# Patient Record
Sex: Male | Born: 1960 | Race: Black or African American | Hispanic: No | Marital: Married | Smoking: Never smoker
Health system: Southern US, Community
[De-identification: ages and names within clinical notes are randomized; demographics above are authoritative.]

## PROBLEM LIST (undated history)

## (undated) DIAGNOSIS — E119 Type 2 diabetes mellitus without complications: Secondary | ICD-10-CM

## (undated) DIAGNOSIS — K8020 Calculus of gallbladder without cholecystitis without obstruction: Secondary | ICD-10-CM

## (undated) DIAGNOSIS — I10 Essential (primary) hypertension: Secondary | ICD-10-CM

## (undated) DIAGNOSIS — C61 Malignant neoplasm of prostate: Secondary | ICD-10-CM

## (undated) DIAGNOSIS — N393 Stress incontinence (female) (male): Secondary | ICD-10-CM

## (undated) HISTORY — DX: Type 2 diabetes mellitus without complications: E11.9

## (undated) HISTORY — PX: NOSE SURGERY: SHX723

## (undated) HISTORY — DX: Calculus of gallbladder without cholecystitis without obstruction: K80.20

## (undated) HISTORY — PX: KNEE SURGERY: SHX244

## (undated) HISTORY — PX: PROSTATE BIOPSY: SHX241

## (undated) HISTORY — PX: CHOLECYSTECTOMY: SHX55

## (undated) HISTORY — DX: Essential (primary) hypertension: I10

## (undated) HISTORY — PX: VASECTOMY: SHX75

---

## 2001-09-24 ENCOUNTER — Emergency Department (HOSPITAL_COMMUNITY): Admission: EM | Admit: 2001-09-24 | Discharge: 2001-09-24 | Payer: Self-pay | Admitting: Emergency Medicine

## 2001-09-24 ENCOUNTER — Encounter: Payer: Self-pay | Admitting: Emergency Medicine

## 2004-10-16 ENCOUNTER — Ambulatory Visit (HOSPITAL_COMMUNITY): Admission: RE | Admit: 2004-10-16 | Discharge: 2004-10-16 | Payer: Self-pay | Admitting: Orthopedic Surgery

## 2006-07-03 IMAGING — CR DG ORBITS FOR FOREIGN BODY
2 series · 2 of 2 positions shown · non-contrast
Comparison: none

CLINICAL DATA: Metal exposure to the eyes, pre-MRI evaluation.

EYE FOR FOREIGN BODY DETECTION
Two frontal views of the orbits demonstrate no orbital metallic foreign bodies. 
Right maxillary sinus air-fluid level. Small frontal sinuses.
IMPRESSION
1. No orbital metallic foreign body.
2. Acute right maxillary sinusitis.

[view not recorded (1 of 2)]
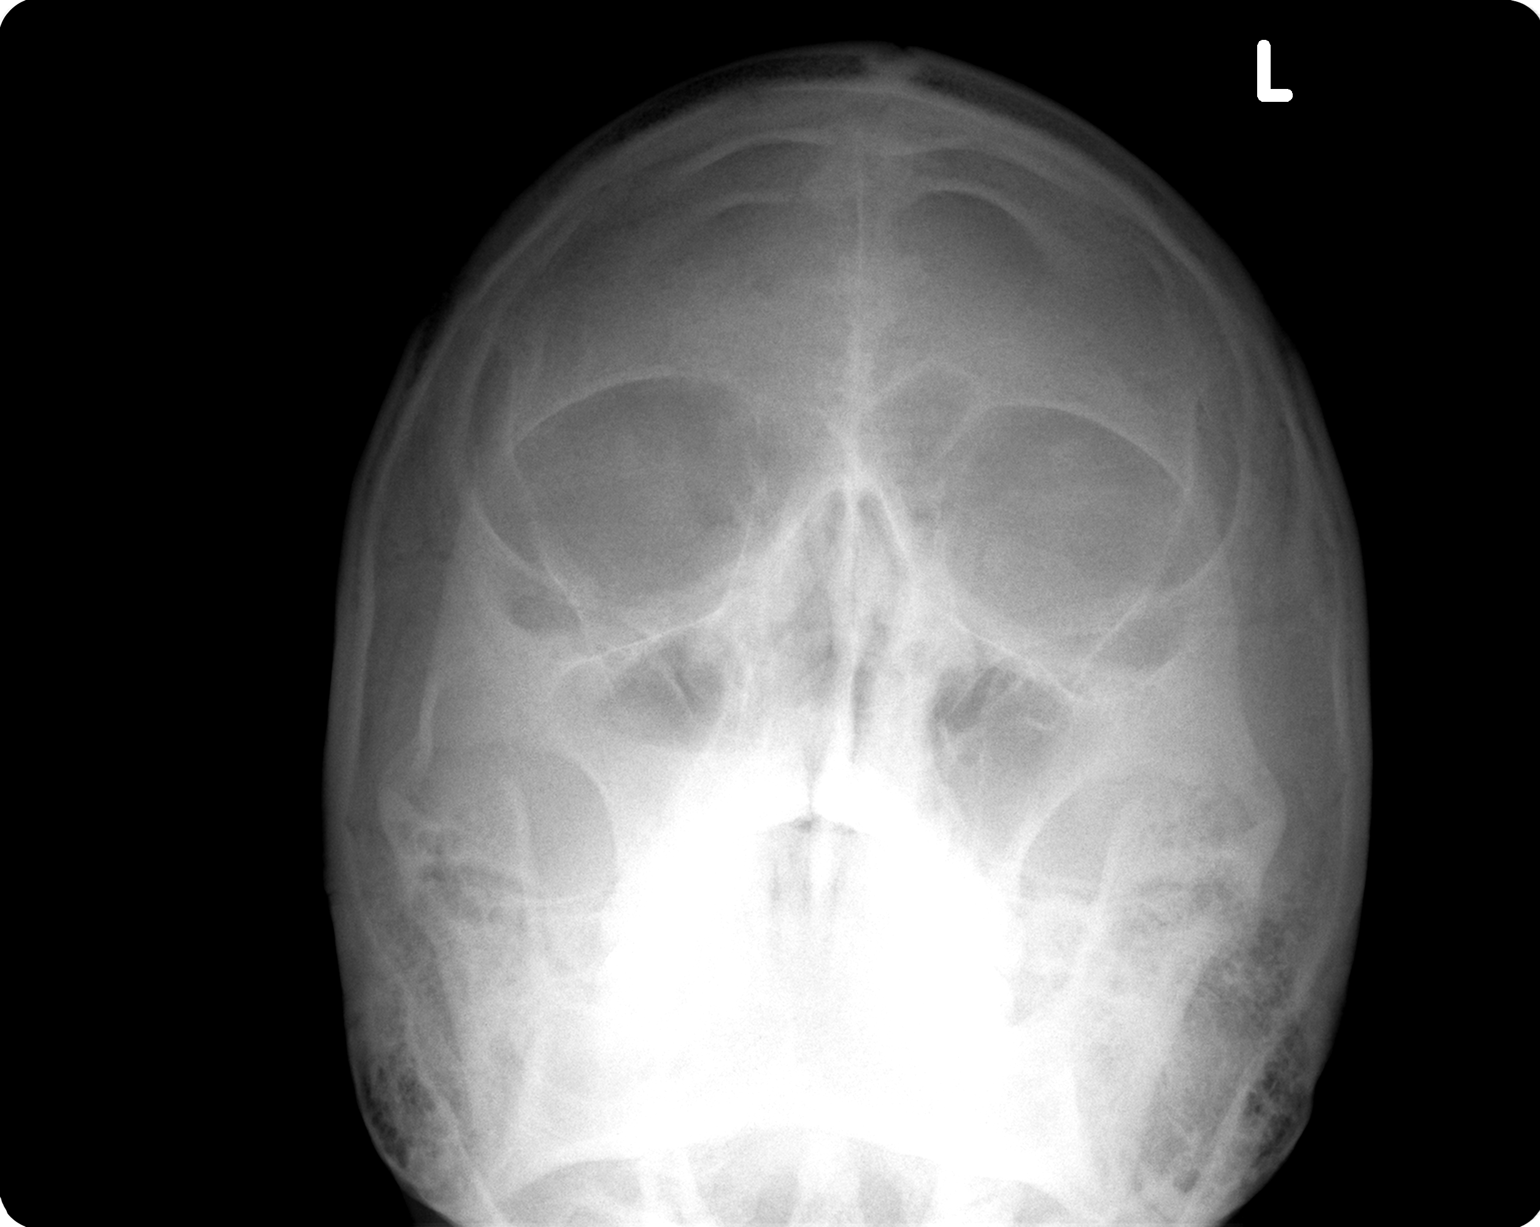

[view not recorded (2 of 2)]
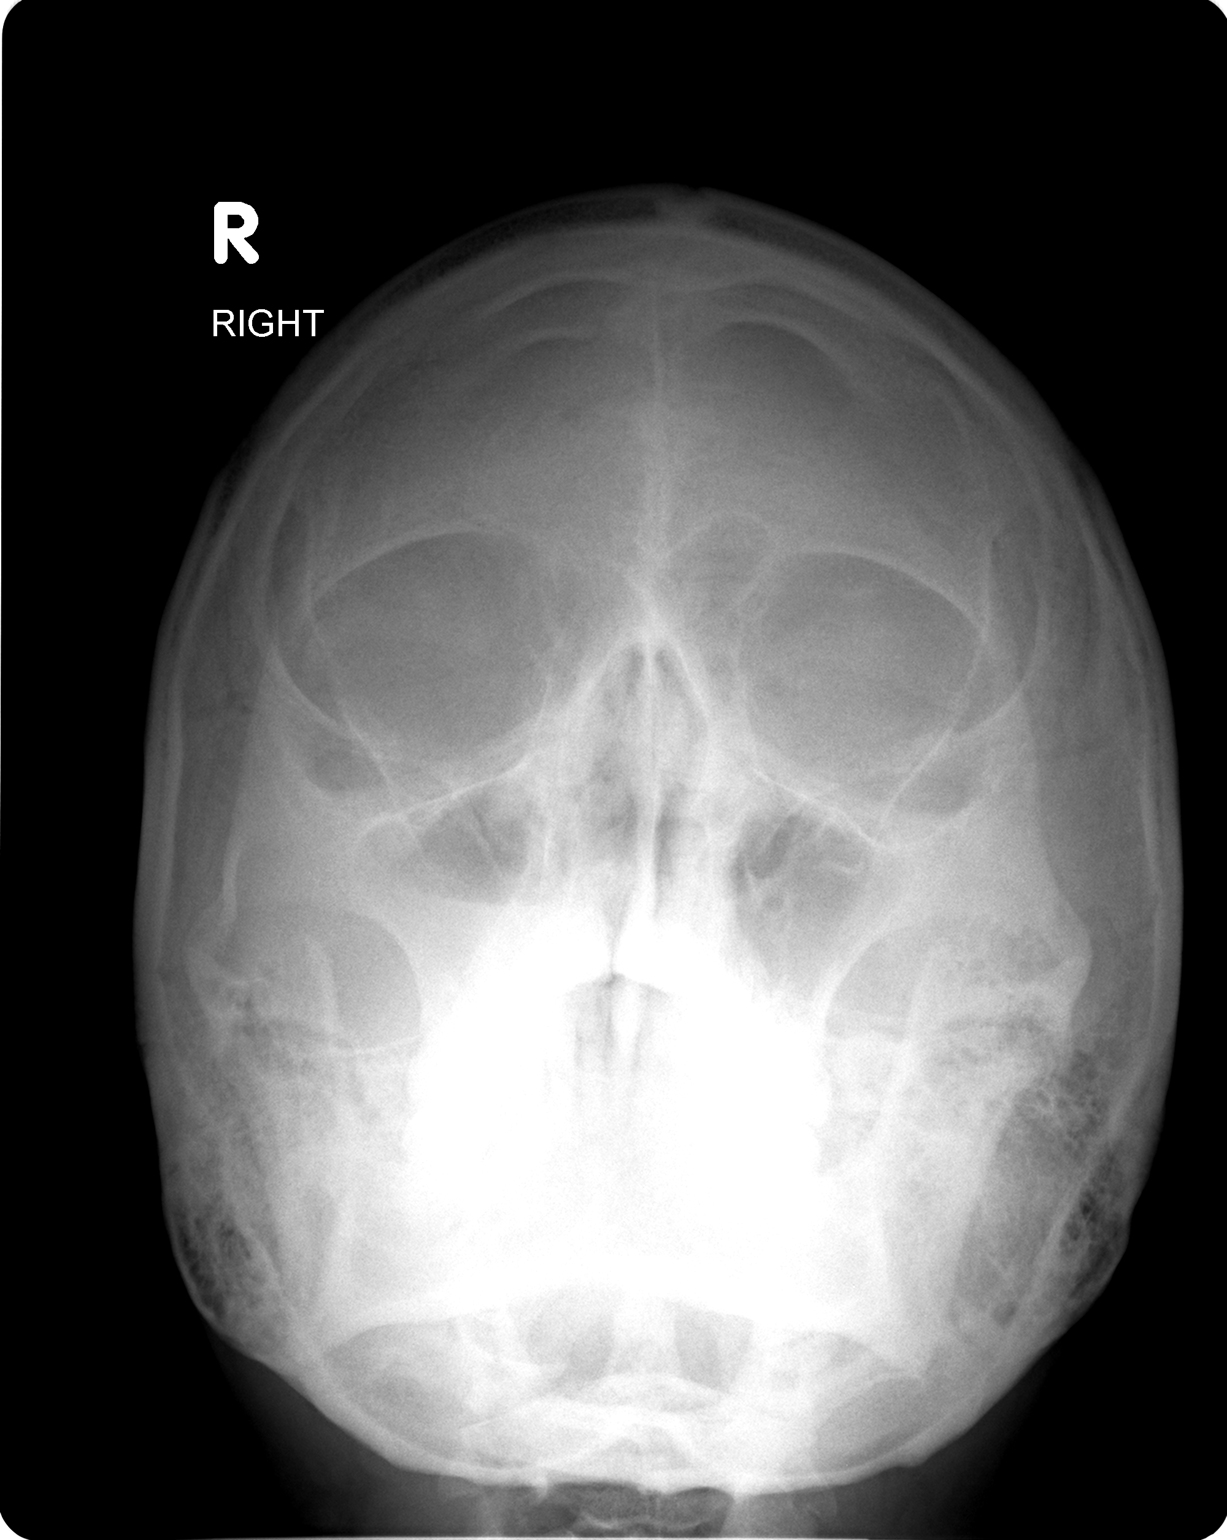

[2 of 2 positions shown; findings below may reference images not displayed]

## 2011-04-15 ENCOUNTER — Other Ambulatory Visit: Payer: Self-pay | Admitting: Family Medicine

## 2011-04-15 DIAGNOSIS — R945 Abnormal results of liver function studies: Secondary | ICD-10-CM

## 2011-04-17 ENCOUNTER — Ambulatory Visit
Admission: RE | Admit: 2011-04-17 | Discharge: 2011-04-17 | Disposition: A | Discharge status: Home / Self Care | Payer: BC Managed Care – PPO | Source: Ambulatory Visit | Attending: Family Medicine | Admitting: Family Medicine

## 2011-04-17 DIAGNOSIS — R945 Abnormal results of liver function studies: Secondary | ICD-10-CM

## 2012-03-15 ENCOUNTER — Encounter: Payer: Self-pay | Admitting: Gastroenterology

## 2012-04-01 ENCOUNTER — Ambulatory Visit (INDEPENDENT_AMBULATORY_CARE_PROVIDER_SITE_OTHER): Payer: BC Managed Care – PPO | Admitting: Gastroenterology

## 2012-04-01 ENCOUNTER — Encounter: Payer: Self-pay | Admitting: Gastroenterology

## 2012-04-01 VITALS — BP 120/70 | HR 76 | Ht 69.0 in | Wt 195.6 lb

## 2012-04-01 DIAGNOSIS — Z1211 Encounter for screening for malignant neoplasm of colon: Secondary | ICD-10-CM

## 2012-04-01 MED ORDER — PEG-KCL-NACL-NASULF-NA ASC-C 100 G PO SOLR: 1.0000 | Freq: Once | ORAL | Status: DC

## 2012-04-01 NOTE — Patient Instructions (Addendum)
You will be set up for a colonoscopy for routine screening.  Double prep (LEC, moderate sedation) A copy of this information will be made available to Dr. Warrick Parisian.

## 2012-04-01 NOTE — Progress Notes (Signed)
HPI: This is a   very pleasant 52 year old man whom I am meeting for the first time today  Had colonoscopy 09/2010 with Dr. Rodena Piety "inadequate prep" single small polyp removed, was HP on pathology.  Was told to have repeat colonoscopy 1 year due to prep.  He was not satisfied with whole experience.  Tells me he had to wait about 3 hours in the waiting room before his procedure and was never given an explanation as to why  No colon cancer in family, no bowel issues.   Review of systems: Pertinent positive and negative review of systems were noted in the above HPI section. Complete review of systems was performed and was otherwise normal.    Past Medical History  Diagnosis Date  . Diabetes   . Hypertension   . Allergic rhinitis, cause unspecified   . Gallstones     Past Surgical History  Procedure Date  . Cholecystectomy   . Knee surgery     right  . Nose surgery     Current Outpatient Prescriptions  Medication Sig Dispense Refill  . metFORMIN (GLUCOPHAGE) 500 MG tablet Take 500 mg by mouth daily with breakfast.        Allergies as of 04/01/2012  . (No Known Allergies)    Family History  Problem Relation Age of Onset  . Diabetes Father   . Multiple myeloma Paternal Grandfather   . Ovarian cancer Paternal Grandmother     History   Social History  . Marital Status: Married    Spouse Name: N/A    Number of Children: N/A  . Years of Education: N/A   Occupational History  . Not on file.   Social History Main Topics  . Smoking status: Never Smoker   . Smokeless tobacco: Not on file  . Alcohol Use: Yes     Comment: 1/2 alcoholic beverage daily  . Drug Use: No  . Sexually Active: Not on file   Other Topics Concern  . Not on file   Social History Narrative  . No narrative on file       Physical Exam: BP 120/70  Pulse 76  Ht 5\' 9"  (1.753 m)  Wt 195 lb 9.6 oz (88.724 kg)  BMI 28.89 kg/m2 Constitutional: generally well-appearing Psychiatric:  alert and oriented x3 Eyes: extraocular movements intact Mouth: oral pharynx moist, no lesions Neck: supple no lymphadenopathy Cardiovascular: heart regular rate and rhythm Lungs: clear to auscultation bilaterally Abdomen: soft, nontender, nondistended, no obvious ascites, no peritoneal signs, normal bowel sounds Extremities: no lower extremity edema bilaterally Skin: no lesions on visible extremities    Assessment and plan: 52 y.o. male with  routine risk for colon cancer, inadequate prep on colonoscopy 1-2 years ago at outside facility  We will proceed with full colonoscopy at his soonest convenience. He'll undergo a "double prep" in order to ensure good visibility during the procedure.

## 2012-04-25 ENCOUNTER — Other Ambulatory Visit: Payer: Self-pay | Admitting: Gastroenterology

## 2012-04-25 ENCOUNTER — Ambulatory Visit (AMBULATORY_SURGERY_CENTER): Payer: BC Managed Care – PPO | Admitting: Gastroenterology

## 2012-04-25 ENCOUNTER — Encounter: Payer: Self-pay | Admitting: Gastroenterology

## 2012-04-25 VITALS — BP 102/63 | HR 65 | Resp 15 | Ht 69.0 in | Wt 195.0 lb

## 2012-04-25 DIAGNOSIS — Z1211 Encounter for screening for malignant neoplasm of colon: Secondary | ICD-10-CM

## 2012-04-25 DIAGNOSIS — D126 Benign neoplasm of colon, unspecified: Secondary | ICD-10-CM

## 2012-04-25 MED ORDER — SODIUM CHLORIDE 0.9 % IV SOLN: 500.0000 mL | INTRAVENOUS | Status: DC

## 2012-04-25 NOTE — Op Note (Signed)
[facility] [address] [place], [address]   COLONOSCOPY PROCEDURE REPORT  PATIENT: Mario Molina, Mario Molina  MR#: [id number] BIRTHDATE: 18-Feb-1961 , 52  yrs. old GENDER: Male ENDOSCOPIST: Rachael Fee, MD REFERRED NW:GNFAOZ Buckner Malta, M.D. PROCEDURE DATE:  04/25/2012 PROCEDURE:   Colonoscopy, screening ASA CLASS:   Class II INDICATIONS:average risk screening, had colonoscopy 2012 with Dr. Noe Gens with poor prep, small HP found MEDICATIONS: Fentanyl 75 mcg IV, Versed 8 mg IV, and These medications were titrated to patient response per physician's verbal order  DESCRIPTION OF PROCEDURE:   After the risks benefits and alternatives of the procedure were thoroughly explained, informed consent was obtained.  A digital rectal exam revealed no abnormalities of the rectum.   The LB [id number]  endoscope was introduced through the anus and advanced to the cecum, which was identified by both the appendix and ileocecal valve. No adverse events experienced.   The quality of the prep was good, using MoviPrep  The instrument was then slowly withdrawn as the colon was fully examined.  COLON FINDINGS: There was one small polyp found, removed and not retrieved.  This wsa 2mm, sessile, ascending segment, removed with cold snare.  The examination was otherwise normal.  Retroflexed views revealed no abnormalities. The time to cecum=1 minutes 53 seconds.  Withdrawal time=9 minutes 39 seconds.  The scope was withdrawn and the procedure completed. COMPLICATIONS: There were no complications.  ENDOSCOPIC IMPRESSION: There was one small polyp found, removed and not retrieved. The examination was otherwise normal.  RECOMMENDATIONS: You should continue to follow colorectal cancer screening guidelines for "routine risk" patients with a repeat colonoscopy in 10 years. There is no need for FOBT (stool) testing for at least 5 years.   eSigned:  Rachael Fee, MD 04/25/2012 3:02  PM

## 2012-04-25 NOTE — Patient Instructions (Signed)
YOU HAD AN ENDOSCOPIC PROCEDURE TODAY AT THE [facility]: Refer to the procedure report that was given to you for any specific questions about what was found during the examination.  If the procedure report does not answer your questions, please call your gastroenterologist to clarify.  If you requested that your care partner not be given the details of your procedure findings, then the procedure report has been included in a sealed envelope for you to review at your convenience later.  YOU SHOULD EXPECT: Some feelings of bloating in the abdomen. Passage of more gas than usual.  Walking can help get rid of the air that was put into your GI tract during the procedure and reduce the bloating. If you had a lower endoscopy (such as a colonoscopy or flexible sigmoidoscopy) you may notice spotting of blood in your stool or on the toilet paper. If you underwent a bowel prep for your procedure, then you may not have a normal bowel movement for a few days.  DIET: Your first meal following the procedure should be a light meal and then it is ok to progress to your normal diet.  A half-sandwich or bowl of soup is an example of a good first meal.  Heavy or fried foods are harder to digest and may make you feel nauseous or bloated.  Likewise meals heavy in dairy and vegetables can cause extra gas to form and this can also increase the bloating.  Drink plenty of fluids but you should avoid alcoholic beverages for 24 hours.  ACTIVITY: Your care partner should take you home directly after the procedure.  You should plan to take it easy, moving slowly for the rest of the day.  You can resume normal activity the day after the procedure however you should NOT DRIVE or use heavy machinery for 24 hours (because of the sedation medicines used during the test).    SYMPTOMS TO REPORT IMMEDIATELY: A gastroenterologist can be reached at any hour.  During normal business hours, 8:30 AM to 5:00 PM Monday through Friday,  call [phone].  After hours and on weekends, please call the GI answering service at [phone] who will take a message and have the physician on call contact you.   Following lower endoscopy (colonoscopy or flexible sigmoidoscopy):  Excessive amounts of blood in the stool  Significant tenderness or worsening of abdominal pains  Swelling of the abdomen that is new, acute  Fever of 100F or higher    FOLLOW UP: If any biopsies were taken you will be contacted by phone or by letter within the next 1-3 weeks.  Call your gastroenterologist if you have not heard about the biopsies in 3 weeks.  Our staff will call the home number listed on your records the next business day following your procedure to check on you and address any questions or concerns that you may have at that time regarding the information given to you following your procedure. This is a courtesy call and so if there is no answer at the home number and we have not heard from you through the emergency physician on call, we will assume that you have returned to your regular daily activities without incident.  SIGNATURES/CONFIDENTIALITY: You and/or your care partner have signed paperwork which will be entered into your electronic medical record.  These signatures attest to the fact that that the information above on your After Visit Summary has been reviewed and is understood.  Full responsibility of the confidentiality   of this discharge information lies with you and/or your care-partner.     

## 2012-04-25 NOTE — Progress Notes (Signed)
Patient did not experience any of the following events: a burn prior to discharge; a fall within the facility; wrong site/side/patient/procedure/implant event; or a hospital transfer or hospital admission upon discharge from the facility. (G8907) Patient did not have preoperative order for IV antibiotic SSI prophylaxis. (G8918)  

## 2012-04-25 NOTE — Progress Notes (Signed)
Ascending colon polyp removed but not collected for pathology.

## 2012-04-26 ENCOUNTER — Telehealth: Payer: Self-pay | Admitting: *Deleted

## 2012-04-26 NOTE — Telephone Encounter (Signed)
  Follow up Call-  Call back number 04/25/2012  Post procedure Call Back phone  # [phone]  Permission to leave phone message Yes     Patient questions:  Do you have a fever, pain , or abdominal swelling? no Pain Score  0 *  Have you tolerated food without any problems? yes  Have you been able to return to your normal activities? yes  Do you have any questions about your discharge instructions: Diet   no Medications  no Follow up visit  no  Do you have questions or concerns about your Care? no  Actions: * If pain score is 4 or above: No action needed, pain <4.

## 2013-01-01 IMAGING — US US ABDOMEN COMPLETE
1 series · 14 of 25 positions shown · non-contrast
Comparison: None.

CLINICAL DATA: Abnormal liver function tests

COMPLETE ABDOMINAL ULTRASOUND

[Series 1: us abdomen complete · 14 of 63 slices shown]
[im 1/63]
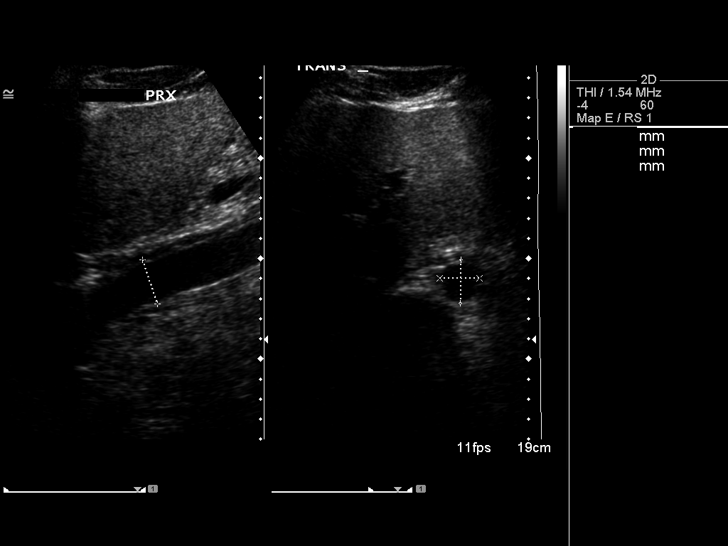
[im 6/63]
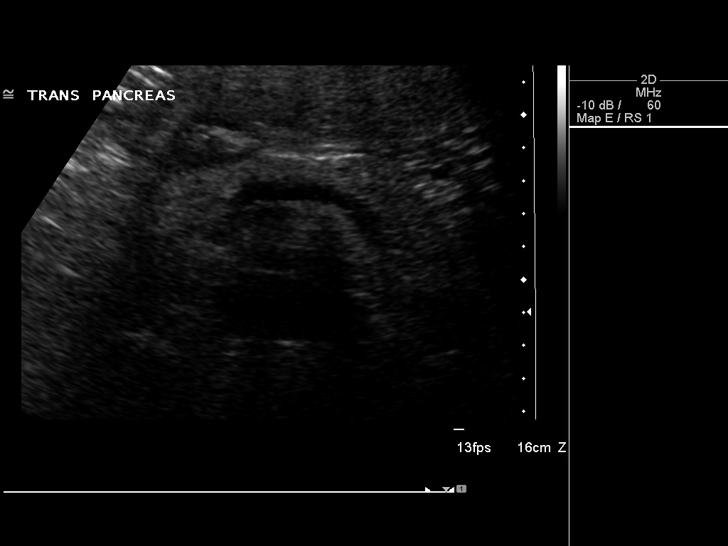
[im 11/63]
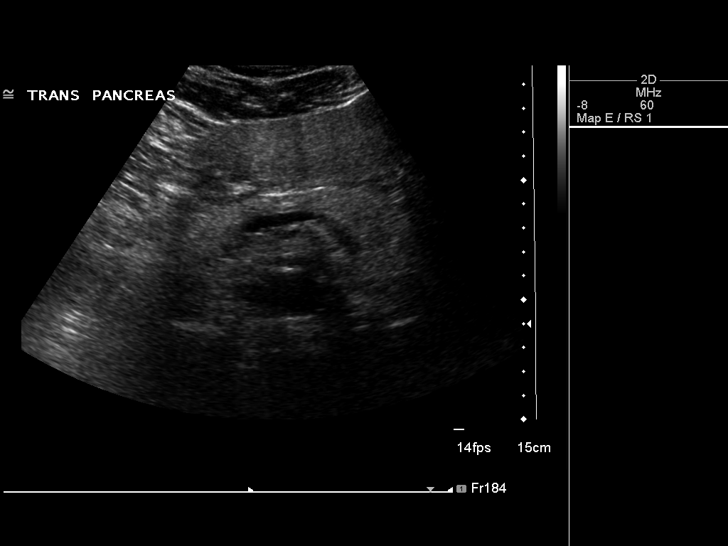
[im 16/63]
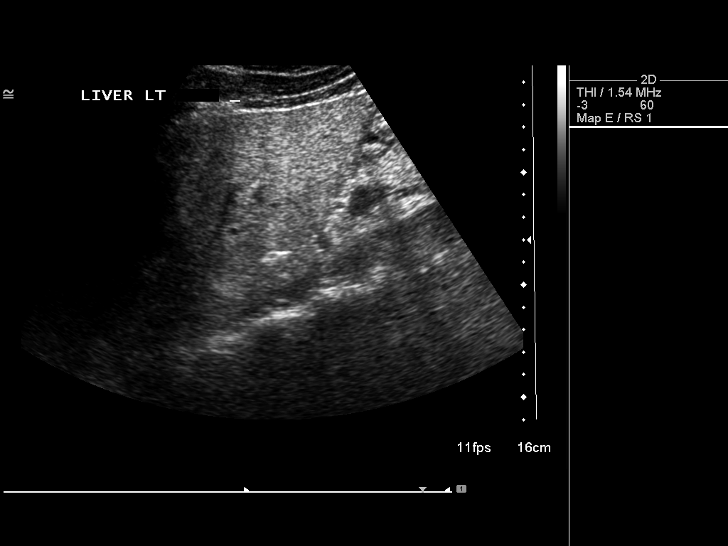
[im 21/63]
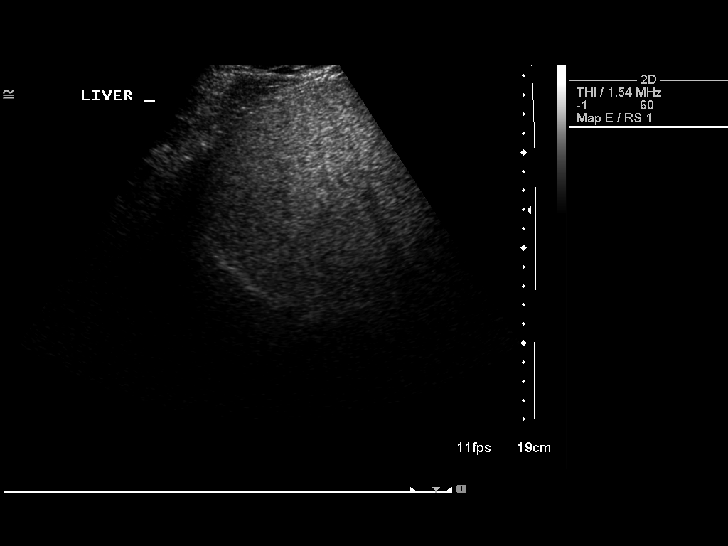
[im 24/63]
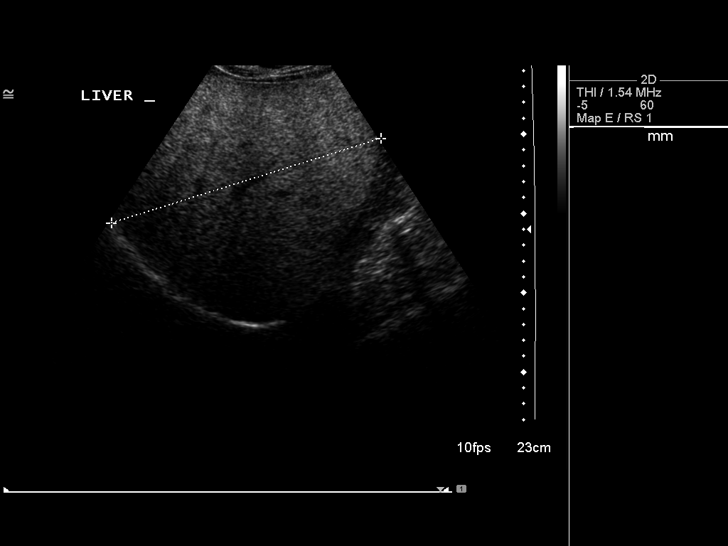
[im 29/63]
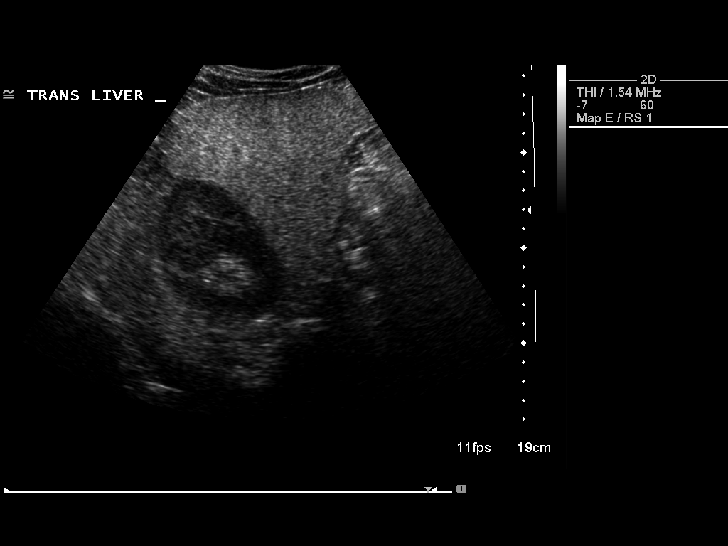
[im 34/63]
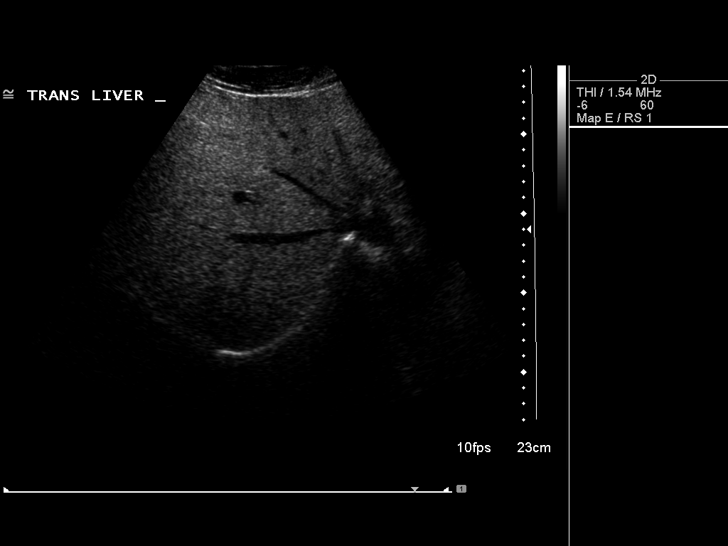
[im 39/63]
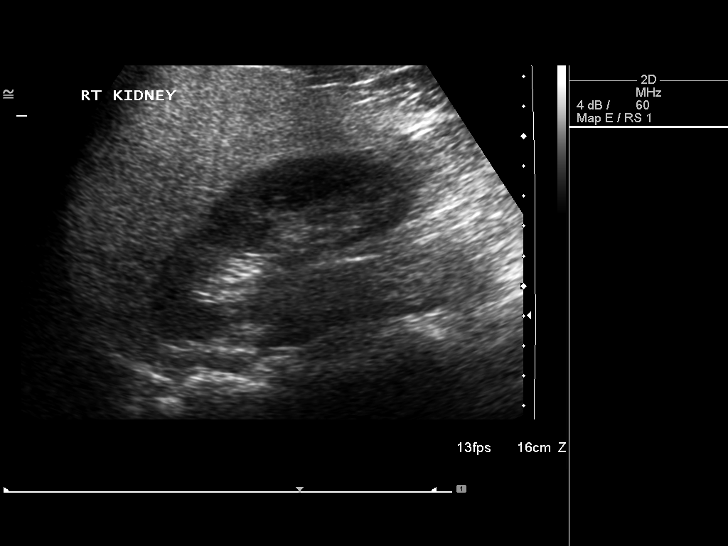
[im 42/63]
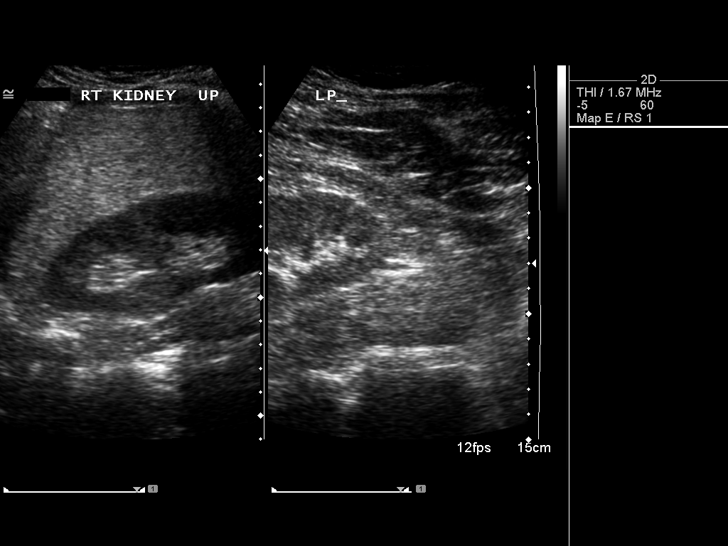
[im 47/63]
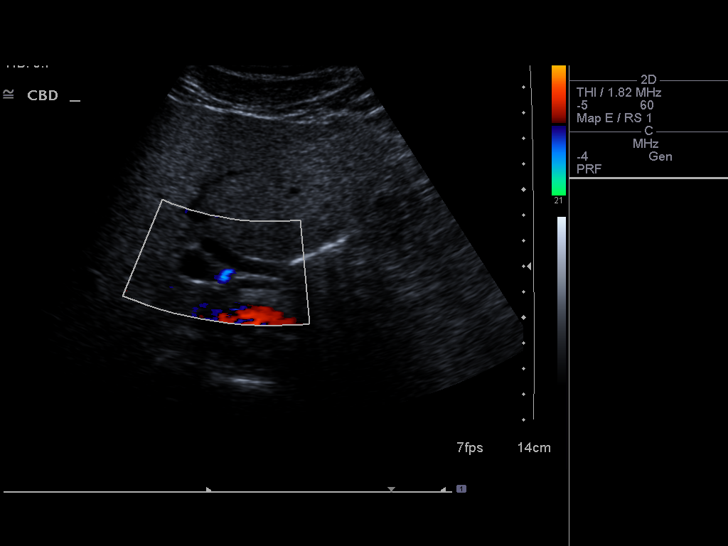
[im 52/63]
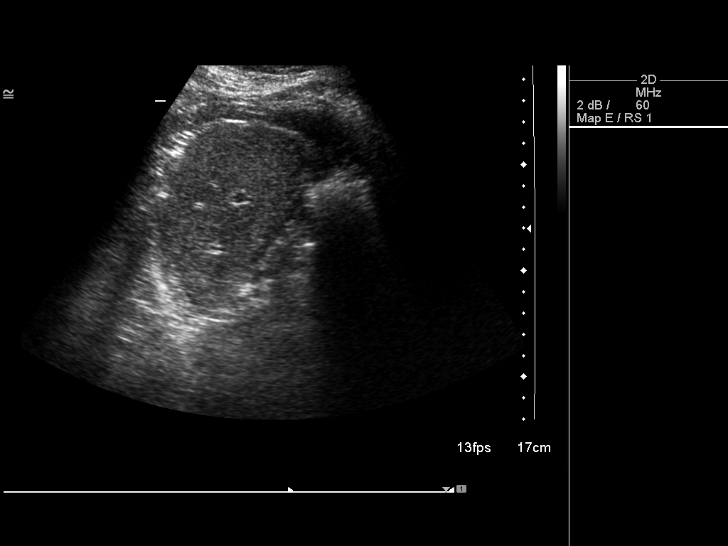
[im 57/63]
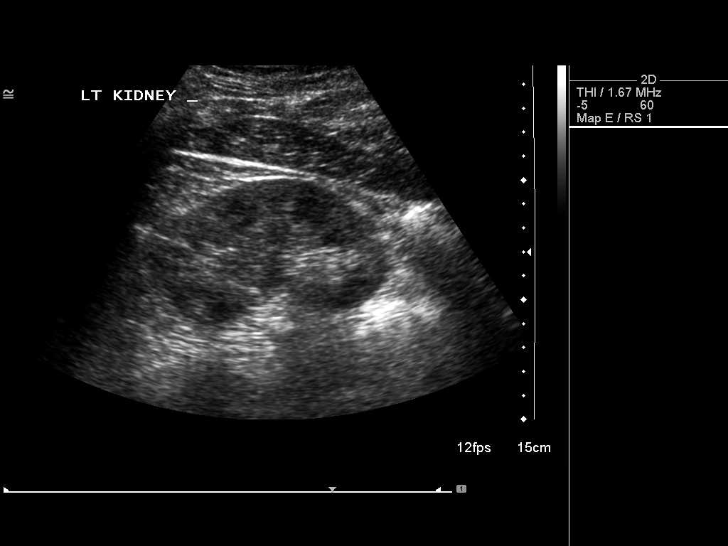
[im 63/63]
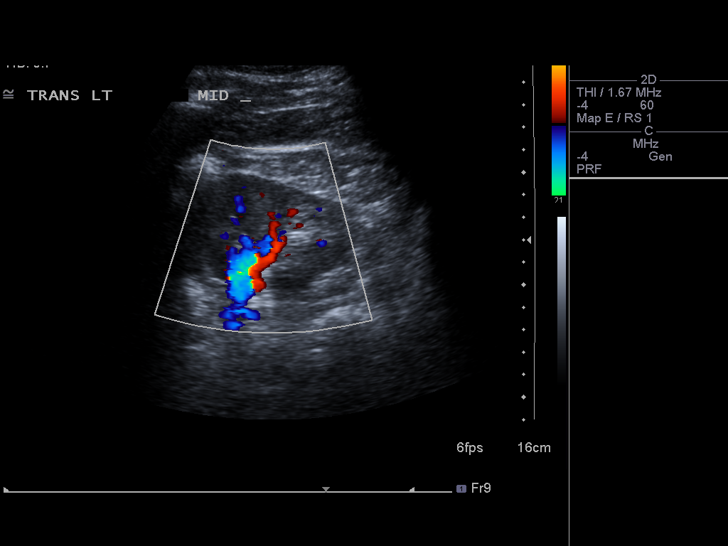

[14 of 25 positions shown; findings below may reference images not displayed]

FINDINGS: Gallbladder:  Vertically absent.

Common bile duct:  Measures 6 mm.

Liver:  No focal lesion identified.  Hyperechoic hepatic
parenchyma, suggesting hepatic steatosis.

IVC:  Appears normal.

Pancreas:  Into the visualized but grossly unremarkable.

Spleen:  Measures 6.2 cm.

Right Kidney:  Measures 10.3 cm.  No mass or hydronephrosis.

Left Kidney:  Measures 10.1 cm.  No mass or hydronephrosis.

Abdominal aorta:  No aneurysm identified.
IMPRESSION: Suspected hepatic steatosis.

Status post cholecystectomy.  Common duct measures 6 mm.

## 2015-10-30 ENCOUNTER — Encounter: Payer: Self-pay | Admitting: Radiation Oncology

## 2015-10-30 NOTE — Progress Notes (Signed)
GU Location of Tumor / Histology: prostatic adenocarcinoma  If Prostate Cancer, Gleason Score is (3 + 4) and PSA is (5.1)  Richardson Chiquito presented with an elevated PSA of 4.8 in December 2016. Biopsy obtained but, no tx pursued. Repeat biopsy done which showed progression 10/14/15  Second biopsy of prostate (if applicable) revealed:  Past/Anticipated interventions by urology, if any: biopsy x 2 and referral to Dr. Tammi Klippel to discuss radiation options  Past/Anticipated interventions by medical oncology, if any: no  Weight changes, if any: no  Bowel/Bladder complaints, if any:  No ED. IPSS 6 with frequency and urgency. Denies incontinence or leakage. Denies dysuria or hematuria. Denies any bowel complaints.   Nausea/Vomiting, if any: no  Pain issues, if any:  no  SAFETY ISSUES:  Prior radiation? no  Pacemaker/ICD? no  Possible current pregnancy? no  Is the patient on methotrexate? no  Current Complaints / other details:  55 year old male. NKDA. Married with 1 son. Working full time. Paternal grandmother had ovarian ca and paternal grandfather had multiple myeloma. Patient denies any known hx of breast or prostate cancer. Prostate volume 54.6 cc.

## 2015-10-31 ENCOUNTER — Other Ambulatory Visit: Payer: Self-pay | Admitting: Urology

## 2015-10-31 NOTE — Progress Notes (Signed)
Please sign orders in EPIC as patient is being scheduled for a pre-op appointment at [facility]! Thank you!

## 2015-11-04 ENCOUNTER — Ambulatory Visit
Admission: RE | Admit: 2015-11-04 | Discharge: 2015-11-04 | Disposition: A | Discharge status: Home / Self Care | Payer: BC Managed Care – PPO | Source: Ambulatory Visit | Attending: Radiation Oncology | Admitting: Radiation Oncology

## 2015-11-04 ENCOUNTER — Encounter: Payer: Self-pay | Admitting: Radiation Oncology

## 2015-11-04 DIAGNOSIS — Z7984 Long term (current) use of oral hypoglycemic drugs: Secondary | ICD-10-CM | POA: Diagnosis not present

## 2015-11-04 DIAGNOSIS — Z833 Family history of diabetes mellitus: Secondary | ICD-10-CM | POA: Insufficient documentation

## 2015-11-04 DIAGNOSIS — I10 Essential (primary) hypertension: Secondary | ICD-10-CM | POA: Diagnosis not present

## 2015-11-04 DIAGNOSIS — C61 Malignant neoplasm of prostate: Secondary | ICD-10-CM | POA: Diagnosis present

## 2015-11-04 DIAGNOSIS — Z79899 Other long term (current) drug therapy: Secondary | ICD-10-CM | POA: Insufficient documentation

## 2015-11-04 DIAGNOSIS — E119 Type 2 diabetes mellitus without complications: Secondary | ICD-10-CM | POA: Diagnosis not present

## 2015-11-04 HISTORY — DX: Malignant neoplasm of prostate: C61

## 2015-11-04 NOTE — Progress Notes (Signed)
Radiation Oncology         [phone] ________________________________  Initial Outpatient Consultation  Name: Mario Molina MRN: [id number]  Date: 11/04/2015  DOB: 1960/05/10  LE:[id number],BSWHQP, MD (Inactive)  Nickie Retort, MD   REFERRING PHYSICIAN: Nickie Retort, MD  DIAGNOSIS: 55 y.o. gentleman with stage T1c adenocarcinoma of the prostate with a Gleason's score of 3+4 and a PSA of 5.11    ICD-9-CM ICD-10-CM   1. Malignant neoplasm of prostate ([place]) [place] referral to [place] is a 55 y.o. gentleman who was noted to have an elevated PSA of 4.8 in December 2016 by his primary care physician, Dr. Glennon Mac. In February 2017, his PSA rose to 5.11 and he was seen in urology by Dr. Pilar Jarvis on 04/08/15. Digital rectal examination was performed at that time revealing no nodules. The patient proceeded to transrectal ultrasound with 12 biopsies of the prostate on 06/03/15 and low risk Gleason 3+3 prostate cancer seen in one core biopsy, the right base, and the patient expressed interest in active surveillance.  The patient once again proceeded to transrectal ultrasound with 12 biopsies of the prostate on 10/14/15.  The prostate volume measured 54.56 cc.  Out of 12 core biopsies, 2 were positive.  The maximum Gleason score was 3+4 in the right lateral apex and 3+3 in the left lateral apex.    The patient reviewed the biopsy results with his urologist and he has kindly been referred today for discussion of potential radiation treatment options.  PREVIOUS RADIATION THERAPY: No  PAST MEDICAL HISTORY:  has a past medical history of Blood transfusion without reported diagnosis; Diabetes ([place]); Gallstones; Hypertension; and Prostate cancer ([place]).    PAST SURGICAL HISTORY: Past Surgical History:  Procedure Laterality Date  . CHOLECYSTECTOMY    . KNEE SURGERY     right  . NOSE SURGERY    . PROSTATE BIOPSY    . PROSTATE BIOPSY       FAMILY HISTORY: family history includes Diabetes in his father; Multiple myeloma in his paternal grandfather; Ovarian cancer in his paternal grandmother.  SOCIAL HISTORY:  reports that he has never smoked. He has never used smokeless tobacco. He reports that he drinks alcohol. He reports that he does not use drugs.  ALLERGIES: Review of patient's allergies indicates no known allergies.  MEDICATIONS:  Current Outpatient Prescriptions  Medication Sig Dispense Refill  . metFORMIN (GLUCOPHAGE) 500 MG tablet Take 500 mg by mouth daily with breakfast.    . olmesartan (BENICAR) 20 MG tablet Take 20 mg by mouth daily.     No current facility-administered medications for this encounter.     REVIEW OF SYSTEMS:  A 15 point review of systems is documented in the electronic medical record. This was obtained by the nursing staff. However, I reviewed this with the patient to discuss relevant findings and make appropriate changes.  Pertinent items noted in HPI and remainder of comprehensive ROS otherwise negative..  The patient completed an IPSS and IIEF questionnaire.  His IPSS score was 6 indicating mild urinary outflow obstructive symptoms. The patient denies incontinence, leakage, dysuria, hematuria, or bowel complaints. He reports frequency and urgency. He indicated that his erectile function is able to complete sexual activity on most attempts.   PHYSICAL EXAM: This patient is in no acute distress.  He is alert and oriented.   height is 5' 9"  (1.753 m) and weight is 192 lb 3.2 oz (87.2 kg). His  blood pressure is 118/74 and his pulse is 67. His respiration is 16 and oxygen saturation is 100%.  He exhibits no respiratory distress or labored breathing.  He appears neurologically intact.  His mood is pleasant.  His affect is appropriate.  Please note the digital rectal exam findings described above.  KPS = 100  100 - Normal; no complaints; no evidence of disease. 90   - Able to carry on normal  activity; minor signs or symptoms of disease. 80   - Normal activity with effort; some signs or symptoms of disease. 79   - Cares for self; unable to carry on normal activity or to do active work. 60   - Requires occasional assistance, but is able to care for most of his personal needs. 50   - Requires considerable assistance and frequent medical care. 53   - Disabled; requires special care and assistance. 82   - Severely disabled; hospital admission is indicated although death not imminent. 3   - Very sick; hospital admission necessary; active supportive treatment necessary. 10   - Moribund; fatal processes progressing rapidly. 0     - Dead  Karnofsky DA, Abelmann WH, Craver LS and Burchenal JH [phone]) The use of the nitrogen mustards in the palliative treatment of carcinoma: with particular reference to bronchogenic carcinoma Cancer 1 634-56   LABORATORY DATA:  No results found for: WBC, HGB, HCT, MCV, PLT No results found for: NA, K, CL, CO2 No results found for: ALT, AST, GGT, ALKPHOS, BILITOT   RADIOGRAPHY: No results found.    IMPRESSION: This gentleman is a 55 y.o. gentleman with stage T1c adenocarcinoma of the prostate with a Gleason's score of 3+4 and a PSA of 5.11.  His T-Stage, Gleason's Score, and PSA put him into the intermediate risk group.  Accordingly he is eligible for a variety of potential treatment options including prostatectomy, seed implant, or IMRT.  PLAN: Today I reviewed the findings and workup thus far.  We discussed the natural history of prostate cancer.  We reviewed the the implications of T-stage, Gleason's Score, and PSA on decision-making and outcomes related to prostate cancer.  We discussed radiation treatment in the management of prostate cancer with regard to the logistics and delivery of external beam radiation treatment as well as the logistics and delivery of prostate brachytherapy.  We compared and contrasted each of these approaches and also compared  these against prostatectomy.    The patient focused most of his questions and interest in robotic-assisted laparoscopic radical prostatectomy.  We discussed some of the potential advantages of surgery including surgical staging, the availability of salvage radiotherapy to the prostatic fossa, and the confidence associated with immediate biochemical response.  We discussed some of the potential proven indications for postoperative radiotherapy including positive margins, extracapsular extension, and seminal vesicle involvement. We also talked about some of the other potential findings leading to a recommendation for radiotherapy including a non-zero postoperative PSA and positive lymph nodes.   The patient would like to proceed with prostatectomy. I enjoyed meeting with him today, and will look forward to participating in the care of this very nice gentleman.  I will look forward to following his progress  I spent time face to face with the patient and more than 50% of that time was spent in counseling and/or coordination of care.   ------------------------------------------------  Sheral Apley. Tammi Klippel, M.D.  This document serves as a record of services personally performed by Tyler Pita, MD. It was created on  his behalf by Darcus Austin, a trained medical scribe. The creation of this record is based on the scribe's personal observations and the provider's statements to them. This document has been checked and approved by the attending provider.

## 2015-11-04 NOTE — Progress Notes (Signed)
See progress note under physician encounter. 

## 2015-11-27 ENCOUNTER — Encounter (HOSPITAL_COMMUNITY)
Admission: RE | Admit: 2015-11-27 | Discharge: 2015-11-27 | Disposition: A | Discharge status: Home / Self Care | Payer: BC Managed Care – PPO | Source: Ambulatory Visit | Attending: Urology | Admitting: Urology

## 2015-11-27 ENCOUNTER — Encounter (HOSPITAL_COMMUNITY): Payer: Self-pay

## 2015-11-27 ENCOUNTER — Ambulatory Visit (HOSPITAL_COMMUNITY)
Admission: RE | Admit: 2015-11-27 | Discharge: 2015-11-27 | Disposition: A | Discharge status: Home / Self Care | Payer: BC Managed Care – PPO | Source: Ambulatory Visit | Attending: Anesthesiology | Admitting: Anesthesiology

## 2015-11-27 ENCOUNTER — Encounter: Payer: Self-pay | Admitting: *Deleted

## 2015-11-27 DIAGNOSIS — Z01812 Encounter for preprocedural laboratory examination: Secondary | ICD-10-CM | POA: Insufficient documentation

## 2015-11-27 DIAGNOSIS — C61 Malignant neoplasm of prostate: Secondary | ICD-10-CM | POA: Diagnosis not present

## 2015-11-27 DIAGNOSIS — Z01810 Encounter for preprocedural cardiovascular examination: Secondary | ICD-10-CM | POA: Diagnosis present

## 2015-11-27 DIAGNOSIS — Z01818 Encounter for other preprocedural examination: Secondary | ICD-10-CM

## 2015-11-27 LAB — BASIC METABOLIC PANEL
Anion gap: 7 (ref 5–15)
BUN: 19 mg/dL (ref 6–20)
CHLORIDE: 103 mmol/L (ref 101–111)
CO2: 27 mmol/L (ref 22–32)
CREATININE: 1.03 mg/dL (ref 0.61–1.24)
Calcium: 9.7 mg/dL (ref 8.9–10.3)
GFR calc Af Amer: >60 mL/min (ref >60)
GFR calc non Af Amer: >60 mL/min (ref >60)
GLUCOSE: 111 mg/dL — AB (ref 65–99)
Potassium: 4.3 mmol/L (ref 3.5–5.1)
Sodium: 137 mmol/L (ref 135–145)

## 2015-11-27 LAB — CBC
HCT: 40.4 % (ref 39.0–52.0)
Hemoglobin: 13.3 g/dL (ref 13.0–17.0)
MCH: 21.4 pg — AB (ref 26.0–34.0)
MCHC: 32.9 g/dL (ref 30.0–36.0)
MCV: 65.1 fL — AB (ref 78.0–100.0)
PLATELETS: 283 10*3/uL (ref 150–400)
RBC: 6.21 MIL/uL — AB (ref 4.22–5.81)
RDW: 15.1 % (ref 11.5–15.5)
WBC: 8.1 10*3/uL (ref 4.0–10.5)

## 2015-11-27 LAB — ABO/RH: ABO/RH(D): O POS

## 2015-11-27 NOTE — Patient Instructions (Signed)
Mario Molina  11/27/2015   Your procedure is scheduled on: 12-05-15   Report to [facility] Main  Entrance take [facility]  elevators to 3rd floor to  Mario [facility] at  [facility]  AM.  Call this number if you have problems the morning of surgery [phone] Follow Bowel prep instructions per office(Magnesium Citrate, Fleet enema -drink clear liquids plentiful day of bowel prep)  Remember: ONLY 1 PERSON MAY GO WITH YOU TO SHORT STAY TO GET  READY MORNING OF YOUR SURGERY.  Do not eat food or drink liquids :After Midnight.     Take these medicines the morning of surgery with A SIP OF WATER: None DO NOT TAKE ANY DIABETIC MEDICATIONS DAY OF YOUR SURGERY                               You may not have any metal on your body including hair pins and              piercings  Do not wear jewelry, make-up, lotions, powders or perfumes, deodorant             Do not wear nail polish.  Do not shave  48 hours prior to surgery.              Men may shave face and neck.   Do not bring valuables to the [facility]. [place].  Contacts, dentures or bridgework may not be worn into surgery.  Leave suitcase in the car. After surgery it may be brought to your room.     Patients discharged the day of surgery will not be allowed to drive home.  Name and phone number of your driver: Mario Molina -spouse [phone] cell  Special Instructions: N/A              Please read over the following fact sheets you were given: _____________________________________________________________________             [facility] - Preparing for Surgery Before surgery, you can play an important role.  Because skin is not sterile, your skin needs to be as free of germs as possible.  You can reduce the number of germs on your skin by washing with CHG (chlorahexidine gluconate) soap before surgery.  CHG is an antiseptic cleaner which kills germs and bonds with  the skin to continue killing germs even after washing. Please DO NOT use if you have an allergy to CHG or antibacterial soaps.  If your skin becomes reddened/irritated stop using the CHG and inform your nurse when you arrive at Short Stay. Do not shave (including legs and underarms) for at least 48 hours prior to the first CHG shower.  You may shave your face/neck. Please follow these instructions carefully:  1.  Shower with CHG Soap the night before surgery and the  morning of Surgery.  2.  If you choose to wash your hair, wash your hair first as usual with your  normal  shampoo.  3.  After you shampoo, rinse your hair and body thoroughly to remove the  shampoo.                           4.  Use CHG as you would any other liquid soap.  You can apply chg directly  to the skin and wash                       Gently with a scrungie or clean washcloth.  5.  Apply the CHG Soap to your body ONLY FROM THE NECK DOWN.   Do not use on face/ open                           Wound or open sores. Avoid contact with eyes, ears mouth and genitals (private parts).                       Wash face,  Genitals (private parts) with your normal soap.             6.  Wash thoroughly, paying special attention to the area where your surgery  will be performed.  7.  Thoroughly rinse your body with warm water from the neck down.  8.  DO NOT shower/wash with your normal soap after using and rinsing off  the CHG Soap.                9.  Pat yourself dry with a clean towel.            10.  Wear clean pajamas.            11.  Place clean sheets on your bed the night of your first shower and do not  sleep with pets. Day of Surgery : Do not apply any lotions/deodorants the morning of surgery.  Please wear clean clothes to the [facility]/surgery center.  FAILURE TO FOLLOW THESE INSTRUCTIONS MAY RESULT IN THE CANCELLATION OF YOUR SURGERY PATIENT SIGNATURE_________________________________  NURSE  SIGNATURE__________________________________  ________________________________________________________________________   M[facility]  An incentive spirometer is a tool that can help keep your lungs clear and active. This tool measures how well you are filling your lungs with each breath. Taking long deep breaths may help reverse or decrease the chance of developing breathing (pulmonary) problems (especially infection) following:  A long period of time when you are unable to move or be active. BEFORE THE PROCEDURE   If the spirometer includes an indicator to show your best effort, your nurse or respiratory therapist will set it to a desired goal.  If possible, sit up straight or lean slightly forward. Try not to slouch.  Hold the incentive spirometer in an upright position. INSTRUCTIONS FOR USE  1. Sit on the edge of your bed if possible, or sit up as far as you can in bed or on a chair. 2. Hold the incentive spirometer in an upright position. 3. Breathe out normally. 4. Place the mouthpiece in your mouth and seal your lips tightly around it. 5. Breathe in slowly and as deeply as possible, raising the piston or the ball toward the top of the column. 6. Hold your breath for 3-5 seconds or for as long as possible. Allow the piston or ball to fall to the bottom of the column. 7. Remove the mouthpiece from your mouth and breathe out normally. 8. Rest for a few seconds and repeat Steps 1 through 7 at least 10 times every 1-2 hours when you are awake. Take your time and take a few normal breaths between deep breaths. 9. The spirometer may include an indicator to show your best  effort. Use the indicator as a goal to work toward during each repetition. 10. After each set of 10 deep breaths, practice coughing to be sure your lungs are clear. If you have an incision (the cut made at the time of surgery), support your incision when coughing by placing a pillow or rolled up towels firmly  against it. Once you are able to get out of bed, walk around indoors and cough well. You may stop using the incentive spirometer when instructed by your caregiver.  RISKS AND COMPLICATIONS  Take your time so you do not get dizzy or light-headed.  If you are in pain, you may need to take or ask for pain medication before doing incentive spirometry. It is harder to take a deep breath if you are having pain. AFTER USE  Rest and breathe slowly and easily.  It can be helpful to keep track of a log of your progress. Your caregiver can provide you with a simple table to help with this. If you are using the spirometer at home, follow these instructions: [place] IF:   You are having difficultly using the spirometer.  You have trouble using the spirometer as often as instructed.  Your pain medication is not giving enough relief while using the spirometer.  You develop fever of 100.5 F (38.1 C) or higher. SEEK IMMEDIATE MEDICAL CARE IF:   You cough up bloody sputum that had not been present before.  You develop fever of 102 F (38.9 C) or greater.  You develop worsening pain at or near the incision site. MAKE SURE YOU:   Understand these instructions.  Will watch your condition.  Will get help right away if you are not doing well or get worse. Document Released: 06/22/2006 Document Revised: 05/04/2011 Document Reviewed: 08/23/2006 [facility] Patient Information 2014 [place], [place].   ________________________________________________________________________

## 2015-11-27 NOTE — Progress Notes (Signed)
[place] Psychosocial Distress Screening Clinical Social Work  Clinical Social Work was referred by distress screening protocol.  The patient scored a 5 on the Psychosocial Distress Thermometer which indicates moderate distress. Clinical Social Worker phoned pt to assess for distress and other psychosocial needs. Pt reports he is experiencing some anxiety around his illness and treatment plan, but reports to have good support in place to assist him. CSW reviewed support programs, role of pt and family support team. CSW also educated pt on Prostate Cancer Support group for additional support. Pt reports adequate coping coping currently. Pt agrees to reach out as needed.   ONCBCN DISTRESS SCREENING 11/04/2015  Screening Type Initial Screening  Distress experienced in past week (1-10) 5  Emotional problem type Adjusting to illness  Physical Problem type Sleep/insomnia  Physician notified of physical symptoms Yes  Referral to clinical psychology No  Referral to clinical social work Yes  Referral to dietition No  Referral to financial advocate No  Referral to support programs No  Referral to palliative care No  Other reports he can be reached on his cell phone at [phone]    Clinical Social Worker follow up needed: No.  If yes, follow up plan:  Loren Racer, [place]  [facility] Phone: [phone] Fax: [phone]

## 2015-11-27 NOTE — Pre-Procedure Instructions (Signed)
EKG/ CXR done today per MD order. 

## 2015-11-28 LAB — HEMOGLOBIN A1C
Hgb A1c MFr Bld: 6 % — ABNORMAL HIGH (ref 4.8–5.6)
Mean Plasma Glucose: 126 mg/dL

## 2015-12-04 NOTE — H&P (Signed)
CC/HPI: CC: Prostate Cancer    Mario Molina is a 55 year old gentleman who was found to have an elevated PSA of 5.11. This prompted a TRUS biopsy of the prostate by Dr. 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 After discussing options, Mario [facility]ina expressed interest in active surveillance. A repeat surveillance 12 core biopsy on 10/14/15 was performed and discovered upgraded disease with 2 out of 12 biopsy cores positive including one at the right lateral apex with 5% of Gleason 3+4=7 disease.   Family history: None   Imaging studies: None   PMH: He has a history of hypertension, hypercholesterolemia, and diabetes.  PSH: Open cholecystectomy   TNM stage: cT1c Nx Mx  PSA: 5.11  Gleason score: 3+4=7  Biopsy (10/14/15): 2/12 cores positive  Left: L lateral apex (5%, 3+3=6)  Right: R lateral apex (5%, 3+4=7)  Prostate volume: 54.6 cc   Nomogram  OC disease: 55%  EPE: 44%  SVI: 2%  LNI: 2%  PFS (5 year, 10 year): 90%,83%   Urinary function: IPSS is 4.  Erectile function: SHIM score is 23.     ALLERGIES: No Allergies    MEDICATIONS: Benicar TABS Oral  MetFORMIN HCl TABS Oral     GU PSH: Prostate Needle Biopsy - 10/14/2015      PSH Notes: Cholecystectomy   NON-GU PSH: Cholecystectomy - 04/08/2015 Surgical Pathology, Gross And Microscopic Examination For Prostate Needle - 10/14/2015    GU PMH: Prostate Cancer - 10/23/2015, - 8/21/2017Prostate Cancer, Prostate cancer - 06/10/2015 BPH w/o LUTS, BPH (benign prostatic hyperplasia) - 06/10/2015 Elevated PSA, Elevated PSA - 04/08/2015    NON-GU PMH: Encounter for general adult medical examination without abnormal findings, Encounter for preventive health examination - 06/03/2015 Personal history of other diseases of the circulatory system, History of hypertension - 04/08/2015 Personal history of other endocrine, nutritional and metabolic disease, History of  diabetes mellitus - 04/08/2015, History of hypercholesterolemia, - 04/08/2015    FAMILY HISTORY: malignant neoplasm of prostate - Runs In Family sickle cell anemia - Runs In Family   SOCIAL HISTORY: Marital Status: Married     Notes: Alcohol use, Married, Mother alive and healthy, Father alive and healthy, Number of children, Never a smoker   REVIEW OF SYSTEMS:    GU Review Male:   Patient denies frequent urination, hard to postpone urination, burning/ pain with urination, get up at night to urinate, leakage of urine, stream starts and stops, trouble starting your streams, and have to strain to urinate .  Gastrointestinal (Upper):   Patient denies nausea and vomiting.  Gastrointestinal (Lower):   Patient denies diarrhea and constipation.  Constitutional:   Patient denies fever, night sweats, weight loss, and fatigue.  Skin:   Patient denies skin rash/ lesion and itching.  Eyes:   Patient denies blurred vision and double vision.  Ears/ Nose/ Throat:   Patient denies sore throat and sinus problems.  Hematologic/Lymphatic:   Patient denies swollen glands and easy bruising.  Cardiovascular:   Patient denies chest pains and leg swelling.  Respiratory:   Patient denies cough and shortness of breath.  Endocrine:   Patient denies excessive thirst.  Musculoskeletal:   Patient denies back pain and joint pain.  Neurological:   Patient denies headaches and dizziness.  Psychologic:   Patient denies depression and anxiety.   VITAL SIGNS:      10/29/2015 08:47 AM  Weight 183 lb /  83.01 kg  Height 70 in / 177.8 cm  BP 148/89 mmHg  Pulse 76 /min  BMI 26.3 kg/m   GU PHYSICAL EXAMINATION:    Prostate: 40 cc. There is subtle nodularity of the right apex. Although this may be biopsy related, this does correlate with his Gleason 7 disease.  Seminal Vesicles: Nonpalpable.  Sphincter Tone: Normal sphincter. No rectal tenderness. No rectal mass.    MULTI-SYSTEM PHYSICAL EXAMINATION:    Constitutional:  Well-nourished. No physical deformities. Normally developed. Good grooming.  Neck: Neck symmetrical, not swollen. Normal tracheal position.  Respiratory: No labored breathing, no use of accessory muscles.   Cardiovascular: Normal temperature, normal extremity pulses, no swelling, no varicosities.  Lymphatic: No enlargement of neck, axillae, groin.  Skin: No paleness, no jaundice, no cyanosis. No lesion, no ulcer, no rash.  Neurologic / Psychiatric: Oriented to time, oriented to place, oriented to person. No depression, no anxiety, no agitation.  Gastrointestinal: No mass, no tenderness, no rigidity, non obese abdomen. Well-healed right upper quadrant incision.  Eyes: Normal conjunctivae. Normal eyelids.  Ears, Nose, Mouth, and Throat: Left ear no scars, no lesions, no masses. Right ear no scars, no lesions, no masses. Nose no scars, no lesions, no masses. Normal hearing. Normal lips.  Musculoskeletal: Normal gait and station of head and neck.     PAST DATA REVIEWED:  Source Of History:  Patient  Lab Test Review:   PSA  Records Review:   Pathology Reports  Urine Test Review:   Urinalysis   04/09/15  PSA  Total PSA 5.11   Free PSA 1.30   % Free PSA 25     PROCEDURES:          Urinalysis w/Scope - 81001 Dipstick Dipstick Cont'd Micro  Specimen: Voided Bilirubin: Neg WBC/hpf: 0-5/hpf  Color: Yellow Ketones: Neg RBC/hpf: 0-2/hpf  Appearance: Clear Blood: 1+ Bacteria: NS (Not Seen)  Specific Gravity: 1.025 Protein: Neg Cystals: NS (Not Seen)  pH: 5.5 Urobilinogen: 0.2 Casts: NS (Not Seen)  Glucose: Neg Nitrites: Neg Trichomonas: Not Present    Leukocyte Esterase: Neg Mucous: Not Present      Epithelial Cells: NS (Not Seen)      Yeast: NS (Not Seen)      Sperm: Not Present    ASSESSMENT:      ICD-10 Details  1 GU:   Prostate Cancer - C61    PLAN:           Schedule Return Visit: Other See Visit Notes             Note: Radiation Oncology consult with Dr. Tammi Klippel.  Return  Visit: Other See Visit Notes             Note: Will call to schedule surgery  Return Visit: Next Available Appointment - PT/OT Referral             Note: Please schedule patient for preoperative PT prior to radical prostatectomy.      Resents here 1        Notes:   1. Prostate cancer: I had a detailed discussion with the patient and his mother today.   The patient was counseled about the natural history of prostate cancer and the standard treatment options that are available for prostate cancer. It was explained to him how his age and life expectancy, clinical stage, Gleason score, and PSA affect his prognosis, the decision to proceed with additional staging studies, as well as how that information influences recommended treatment strategies.  We discussed the roles for active surveillance, radiation therapy, surgical therapy, androgen deprivation, as well as ablative therapy options for the treatment of prostate cancer as appropriate to his individual cancer situation. We discussed the risks and benefits of these options with regard to their impact on cancer control and also in terms of potential adverse events, complications, and impact on quality of life particularly related to urinary and sexual function. The patient was encouraged to ask questions throughout the discussion today and all questions were answered to his stated satisfaction. In addition, the patient was provided with and/or directed to appropriate resources and literature for further education about prostate cancer and treatment options.   We discussed surgical therapy for prostate cancer including the different available surgical approaches. We discussed, in detail, the risks and expectations of surgery with regard to cancer control, urinary control, and erectile function as well as the expected postoperative recovery process. Additional risks of surgery including but not limited to bleeding, infection, hernia formation, nerve damage,  lymphocele formation, bowel/rectal injury potentially necessitating colostomy, damage to the urinary tract resulting in urine leakage, urethral stricture, and the cardiopulmonary risks such as myocardial infarction, stroke, death, venothromboembolism, etc. were explained. The risk of open surgical conversion for robotic/laparoscopic prostatectomy was also discussed.   He does wish to go ahead and schedule surgery although is agreeable to proceed with a radiation oncology consultation as well. He will be scheduled for a bilateral nerve sparing robot-assisted laparoscopic radical prostatectomy and pelvic lymphadenectomy.

## 2015-12-04 NOTE — Anesthesia Preprocedure Evaluation (Addendum)
Anesthesia Evaluation  Patient identified by MRN, date of birth, ID band Patient awake    Reviewed: Allergy & Precautions, NPO status , Patient's Chart, lab work & pertinent test results  Airway Mallampati: II  TM Distance: >3 FB Neck ROM: Full    Dental no notable dental hx.    Pulmonary neg pulmonary ROS,    Pulmonary exam normal breath sounds clear to auscultation       Cardiovascular hypertension, Pt. on medications Normal cardiovascular exam Rhythm:Regular Rate:Normal     Neuro/Psych negative neurological ROS  negative psych ROS   GI/Hepatic negative GI ROS, Neg liver ROS,   Endo/Other  diabetes, Type 2, Oral Hypoglycemic Agents  Renal/GU negative Renal ROS  negative genitourinary   Musculoskeletal negative musculoskeletal ROS (+)   Abdominal   Peds negative pediatric ROS (+)  Hematology negative hematology ROS (+)   Anesthesia Other Findings   Reproductive/Obstetrics negative OB ROS                           Anesthesia Physical Anesthesia Plan  ASA: II  Anesthesia Plan: General   Post-op Pain Management:    Induction: Intravenous  Airway Management Planned: Oral ETT  Additional Equipment:   Intra-op Plan:   Post-operative Plan: Extubation in OR  Informed Consent: I have reviewed the patients History and Physical, chart, labs and discussed the procedure including the risks, benefits and alternatives for the proposed anesthesia with the patient or authorized representative who has indicated his/her understanding and acceptance.   Dental advisory given  Plan Discussed with: CRNA  Anesthesia Plan Comments:         Anesthesia Quick Evaluation

## 2015-12-05 ENCOUNTER — Encounter (HOSPITAL_COMMUNITY)
Admission: RE | Disposition: A | Discharge status: Home / Self Care | Payer: Self-pay | Source: Ambulatory Visit | Attending: Urology

## 2015-12-05 ENCOUNTER — Encounter (HOSPITAL_COMMUNITY): Payer: Self-pay | Admitting: *Deleted

## 2015-12-05 ENCOUNTER — Inpatient Hospital Stay (HOSPITAL_COMMUNITY)
Admission: RE | Admit: 2015-12-05 | Discharge: 2015-12-06 | DRG: 708 | Disposition: A | Discharge status: Home / Self Care | Payer: BC Managed Care – PPO | Source: Ambulatory Visit | Attending: Urology | Admitting: Urology

## 2015-12-05 ENCOUNTER — Inpatient Hospital Stay (HOSPITAL_COMMUNITY): Payer: BC Managed Care – PPO | Admitting: Anesthesiology

## 2015-12-05 DIAGNOSIS — E7800 Pure hypercholesterolemia, unspecified: Secondary | ICD-10-CM | POA: Diagnosis present

## 2015-12-05 DIAGNOSIS — I10 Essential (primary) hypertension: Secondary | ICD-10-CM | POA: Diagnosis present

## 2015-12-05 DIAGNOSIS — C61 Malignant neoplasm of prostate: Principal | ICD-10-CM | POA: Diagnosis present

## 2015-12-05 DIAGNOSIS — E119 Type 2 diabetes mellitus without complications: Secondary | ICD-10-CM | POA: Diagnosis present

## 2015-12-05 DIAGNOSIS — Z8042 Family history of malignant neoplasm of prostate: Secondary | ICD-10-CM

## 2015-12-05 DIAGNOSIS — Z832 Family history of diseases of the blood and blood-forming organs and certain disorders involving the immune mechanism: Secondary | ICD-10-CM

## 2015-12-05 DIAGNOSIS — Z9049 Acquired absence of other specified parts of digestive tract: Secondary | ICD-10-CM

## 2015-12-05 HISTORY — PX: LYMPHADENECTOMY: SHX5960

## 2015-12-05 HISTORY — PX: ROBOT ASSISTED LAPAROSCOPIC RADICAL PROSTATECTOMY: SHX5141

## 2015-12-05 LAB — GLUCOSE, CAPILLARY
GLUCOSE-CAPILLARY: 114 mg/dL — AB (ref 65–99)
GLUCOSE-CAPILLARY: 131 mg/dL — AB (ref 65–99)
Glucose-Capillary: 104 mg/dL — ABNORMAL HIGH (ref 65–99)
Glucose-Capillary: 130 mg/dL — ABNORMAL HIGH (ref 65–99)
Glucose-Capillary: 96 mg/dL (ref 65–99)

## 2015-12-05 LAB — HEMOGLOBIN AND HEMATOCRIT, BLOOD
HCT: 35.9 % — ABNORMAL LOW (ref 39.0–52.0)
HEMOGLOBIN: 11.7 g/dL — AB (ref 13.0–17.0)

## 2015-12-05 LAB — TYPE AND SCREEN
ABO/RH(D): O POS
Antibody Screen: NEGATIVE

## 2015-12-05 SURGERY — XI ROBOTIC ASSISTED LAPAROSCOPIC RADICAL PROSTATECTOMY LEVEL 2
Anesthesia: General

## 2015-12-05 MED ORDER — PROPOFOL 10 MG/ML IV BOLUS
INTRAVENOUS | Status: AC
  Filled 2015-12-05: qty 20

## 2015-12-05 MED ORDER — ARTIFICIAL TEARS OP OINT
TOPICAL_OINTMENT | OPHTHALMIC | Status: AC
  Filled 2015-12-05: qty 3.5

## 2015-12-05 MED ORDER — HYDROMORPHONE HCL 1 MG/ML IJ SOLN
INTRAMUSCULAR | Status: DC | PRN
  Administered 2015-12-05 (×2): 1 mg via INTRAVENOUS

## 2015-12-05 MED ORDER — SODIUM CHLORIDE 0.9 % IV BOLUS (SEPSIS)
1000.0000 mL | Freq: Once | INTRAVENOUS | Status: AC
  Administered 2015-12-05: 1000 mL via INTRAVENOUS

## 2015-12-05 MED ORDER — LIDOCAINE 2% (20 MG/ML) 5 ML SYRINGE
INTRAMUSCULAR | Status: DC | PRN
  Administered 2015-12-05: 100 mg via INTRAVENOUS

## 2015-12-05 MED ORDER — ROCURONIUM BROMIDE 50 MG/5ML IV SOSY
PREFILLED_SYRINGE | INTRAVENOUS | Status: AC
  Filled 2015-12-05: qty 5

## 2015-12-05 MED ORDER — HEPARIN SODIUM (PORCINE) 1000 UNIT/ML IJ SOLN
INTRAMUSCULAR | Status: AC
Start: 2015-12-05 — End: 2015-12-05
  Filled 2015-12-05: qty 1

## 2015-12-05 MED ORDER — MIDAZOLAM HCL 2 MG/2ML IJ SOLN
INTRAMUSCULAR | Status: AC
  Filled 2015-12-05: qty 2

## 2015-12-05 MED ORDER — DOCUSATE SODIUM 100 MG PO CAPS
100.0000 mg | ORAL_CAPSULE | Freq: Two times a day (BID) | ORAL | Status: DC
  Administered 2015-12-05 – 2015-12-06 (×2): 100 mg via ORAL
  Filled 2015-12-05 (×2): qty 1

## 2015-12-05 MED ORDER — PHENYLEPHRINE HCL 10 MG/ML IJ SOLN
INTRAMUSCULAR | Status: DC | PRN
  Administered 2015-12-05 (×3): 80 ug via INTRAVENOUS

## 2015-12-05 MED ORDER — BUPIVACAINE HCL (PF) 0.25 % IJ SOLN
INTRAMUSCULAR | Status: AC
  Filled 2015-12-05: qty 30

## 2015-12-05 MED ORDER — PROPOFOL 10 MG/ML IV BOLUS
INTRAVENOUS | Status: DC | PRN
  Administered 2015-12-05: 200 mg via INTRAVENOUS

## 2015-12-05 MED ORDER — SUGAMMADEX SODIUM 200 MG/2ML IV SOLN
INTRAVENOUS | Status: AC
  Filled 2015-12-05: qty 2

## 2015-12-05 MED ORDER — ONDANSETRON HCL 4 MG/2ML IJ SOLN
INTRAMUSCULAR | Status: DC | PRN
  Administered 2015-12-05: 4 mg via INTRAVENOUS

## 2015-12-05 MED ORDER — MORPHINE SULFATE (PF) 2 MG/ML IV SOLN
2.0000 mg | INTRAVENOUS | Status: DC | PRN
  Administered 2015-12-05: 2 mg via INTRAVENOUS
  Filled 2015-12-05: qty 1

## 2015-12-05 MED ORDER — SUCCINYLCHOLINE CHLORIDE 200 MG/10ML IV SOSY
PREFILLED_SYRINGE | INTRAVENOUS | Status: DC | PRN
  Administered 2015-12-05: 50 mg via INTRAVENOUS
  Administered 2015-12-05: 100 mg via INTRAVENOUS

## 2015-12-05 MED ORDER — HYDROMORPHONE HCL 2 MG/ML IJ SOLN
INTRAMUSCULAR | Status: AC
  Filled 2015-12-05: qty 1

## 2015-12-05 MED ORDER — LIDOCAINE 2% (20 MG/ML) 5 ML SYRINGE
INTRAMUSCULAR | Status: AC
  Filled 2015-12-05: qty 5

## 2015-12-05 MED ORDER — STERILE WATER FOR IRRIGATION IR SOLN
Status: DC | PRN
  Administered 2015-12-05: 1000 mL

## 2015-12-05 MED ORDER — KETOROLAC TROMETHAMINE 15 MG/ML IJ SOLN
15.0000 mg | Freq: Four times a day (QID) | INTRAMUSCULAR | Status: DC
  Administered 2015-12-05 – 2015-12-06 (×5): 15 mg via INTRAVENOUS
  Filled 2015-12-05 (×5): qty 1

## 2015-12-05 MED ORDER — BUPIVACAINE-EPINEPHRINE 0.25% -1:200000 IJ SOLN
INTRAMUSCULAR | Status: DC | PRN
  Administered 2015-12-05: 30 mL

## 2015-12-05 MED ORDER — SODIUM CHLORIDE 0.9 % IR SOLN
Status: DC | PRN
  Administered 2015-12-05: 1000 mL via INTRAVESICAL

## 2015-12-05 MED ORDER — ACETAMINOPHEN 325 MG PO TABS: 650.0000 mg | ORAL_TABLET | ORAL | Status: DC | PRN

## 2015-12-05 MED ORDER — HYDROMORPHONE HCL 1 MG/ML IJ SOLN
0.2500 mg | INTRAMUSCULAR | Status: DC | PRN
  Administered 2015-12-05 (×2): 0.5 mg via INTRAVENOUS

## 2015-12-05 MED ORDER — PROMETHAZINE HCL 25 MG/ML IJ SOLN: 6.2500 mg | INTRAMUSCULAR | Status: DC | PRN

## 2015-12-05 MED ORDER — DIPHENHYDRAMINE HCL 12.5 MG/5ML PO ELIX: 12.5000 mg | ORAL_SOLUTION | Freq: Four times a day (QID) | ORAL | Status: DC | PRN

## 2015-12-05 MED ORDER — DEXAMETHASONE SODIUM PHOSPHATE 10 MG/ML IJ SOLN
INTRAMUSCULAR | Status: AC
  Filled 2015-12-05: qty 1

## 2015-12-05 MED ORDER — ONDANSETRON HCL 4 MG/2ML IJ SOLN
INTRAMUSCULAR | Status: AC
  Filled 2015-12-05: qty 2

## 2015-12-05 MED ORDER — LACTATED RINGERS IV SOLN
INTRAVENOUS | Status: DC | PRN
  Administered 2015-12-05: 08:00:00

## 2015-12-05 MED ORDER — CEFAZOLIN IN D5W 1 GM/50ML IV SOLN
1.0000 g | Freq: Three times a day (TID) | INTRAVENOUS | Status: AC
  Administered 2015-12-05 (×2): 1 g via INTRAVENOUS
  Filled 2015-12-05 (×2): qty 50

## 2015-12-05 MED ORDER — CEFAZOLIN SODIUM-DEXTROSE 2-4 GM/100ML-% IV SOLN
INTRAVENOUS | Status: AC
  Filled 2015-12-05: qty 100

## 2015-12-05 MED ORDER — CEFAZOLIN SODIUM-DEXTROSE 2-4 GM/100ML-% IV SOLN
2.0000 g | INTRAVENOUS | Status: AC
  Administered 2015-12-05: 2 g via INTRAVENOUS
  Filled 2015-12-05: qty 100

## 2015-12-05 MED ORDER — ROCURONIUM BROMIDE 10 MG/ML (PF) SYRINGE
PREFILLED_SYRINGE | INTRAVENOUS | Status: DC | PRN
  Administered 2015-12-05: 20 mg via INTRAVENOUS
  Administered 2015-12-05: 50 mg via INTRAVENOUS
  Administered 2015-12-05: 20 mg via INTRAVENOUS
  Administered 2015-12-05: 10 mg via INTRAVENOUS

## 2015-12-05 MED ORDER — ARTIFICIAL TEARS OP OINT
TOPICAL_OINTMENT | OPHTHALMIC | Status: AC
Start: 2015-12-05 — End: 2015-12-05
  Filled 2015-12-05: qty 3.5

## 2015-12-05 MED ORDER — FENTANYL CITRATE (PF) 250 MCG/5ML IJ SOLN
INTRAMUSCULAR | Status: AC
  Filled 2015-12-05: qty 5

## 2015-12-05 MED ORDER — HYDROMORPHONE HCL 1 MG/ML IJ SOLN
INTRAMUSCULAR | Status: AC
  Filled 2015-12-05: qty 1

## 2015-12-05 MED ORDER — MIDAZOLAM HCL 5 MG/5ML IJ SOLN
INTRAMUSCULAR | Status: DC | PRN
  Administered 2015-12-05: 2 mg via INTRAVENOUS

## 2015-12-05 MED ORDER — POTASSIUM CHLORIDE IN NACL 20-0.45 MEQ/L-% IV SOLN
INTRAVENOUS | Status: DC
  Administered 2015-12-05 – 2015-12-06 (×3): via INTRAVENOUS
  Filled 2015-12-05 (×3): qty 1000

## 2015-12-05 MED ORDER — FENTANYL CITRATE (PF) 100 MCG/2ML IJ SOLN
INTRAMUSCULAR | Status: DC | PRN
  Administered 2015-12-05: 100 ug via INTRAVENOUS
  Administered 2015-12-05: 50 ug via INTRAVENOUS
  Administered 2015-12-05: 100 ug via INTRAVENOUS

## 2015-12-05 MED ORDER — EPHEDRINE 5 MG/ML INJ
INTRAVENOUS | Status: AC
Start: 2015-12-05 — End: 2015-12-05
  Filled 2015-12-05: qty 10

## 2015-12-05 MED ORDER — SUCCINYLCHOLINE CHLORIDE 20 MG/ML IJ SOLN
INTRAMUSCULAR | Status: AC
  Filled 2015-12-05: qty 1

## 2015-12-05 MED ORDER — PHENYLEPHRINE 40 MCG/ML (10ML) SYRINGE FOR IV PUSH (FOR BLOOD PRESSURE SUPPORT)
PREFILLED_SYRINGE | INTRAVENOUS | Status: AC
  Filled 2015-12-05: qty 10

## 2015-12-05 MED ORDER — SUGAMMADEX SODIUM 200 MG/2ML IV SOLN
INTRAVENOUS | Status: DC | PRN
  Administered 2015-12-05: 200 mg via INTRAVENOUS

## 2015-12-05 MED ORDER — DEXAMETHASONE SODIUM PHOSPHATE 10 MG/ML IJ SOLN
INTRAMUSCULAR | Status: DC | PRN
  Administered 2015-12-05: 10 mg via INTRAVENOUS

## 2015-12-05 MED ORDER — DIPHENHYDRAMINE HCL 50 MG/ML IJ SOLN: 12.5000 mg | Freq: Four times a day (QID) | INTRAMUSCULAR | Status: DC | PRN

## 2015-12-05 MED ORDER — HYDROCODONE-ACETAMINOPHEN 5-325 MG PO TABS: 1.0000 | ORAL_TABLET | Freq: Four times a day (QID) | ORAL | 0 refills | Status: DC | PRN

## 2015-12-05 MED ORDER — LACTATED RINGERS IV SOLN
INTRAVENOUS | Status: DC | PRN
  Administered 2015-12-05 (×2): via INTRAVENOUS

## 2015-12-05 MED ORDER — SULFAMETHOXAZOLE-TRIMETHOPRIM 800-160 MG PO TABS: 1.0000 | ORAL_TABLET | Freq: Two times a day (BID) | ORAL | 0 refills | Status: DC

## 2015-12-05 MED ORDER — INSULIN ASPART 100 UNIT/ML ~~LOC~~ SOLN
0.0000 [IU] | SUBCUTANEOUS | Status: DC
Start: 2015-12-05 — End: 2015-12-06

## 2015-12-05 SURGICAL SUPPLY — 57 items
APPLICATOR COTTON TIP 6IN STRL (MISCELLANEOUS) ×4 IMPLANT
CATH FOLEY 2WAY SLVR 18FR 30CC (CATHETERS) ×4 IMPLANT
CATH ROBINSON RED A/P 16FR (CATHETERS) ×4 IMPLANT
CATH ROBINSON RED A/P 8FR (CATHETERS) ×4 IMPLANT
CATH TIEMANN FOLEY 18FR 5CC (CATHETERS) ×4 IMPLANT
CHLORAPREP W/TINT 26ML (MISCELLANEOUS) ×4 IMPLANT
CLIP LIGATING HEM O LOK PURPLE (MISCELLANEOUS) ×8 IMPLANT
COVER SURGICAL LIGHT HANDLE (MISCELLANEOUS) ×4 IMPLANT
COVER TIP SHEARS 8 DVNC (MISCELLANEOUS) ×2 IMPLANT
COVER TIP SHEARS 8MM DA VINCI (MISCELLANEOUS) ×2
CUTTER ECHEON FLEX ENDO 45 340 (ENDOMECHANICALS) ×4 IMPLANT
DECANTER SPIKE VIAL GLASS SM (MISCELLANEOUS) ×4 IMPLANT
DERMABOND ADVANCED (GAUZE/BANDAGES/DRESSINGS) ×2
DERMABOND ADVANCED .7 DNX12 (GAUZE/BANDAGES/DRESSINGS) ×2 IMPLANT
DRAPE ARM DVNC X/XI (DISPOSABLE) ×8 IMPLANT
DRAPE COLUMN DVNC XI (DISPOSABLE) ×2 IMPLANT
DRAPE DA VINCI XI ARM (DISPOSABLE) ×8
DRAPE DA VINCI XI COLUMN (DISPOSABLE) ×2
DRAPE SURG IRRIG POUCH 19X23 (DRAPES) ×4 IMPLANT
DRSG TEGADERM 4X4.75 (GAUZE/BANDAGES/DRESSINGS) ×4 IMPLANT
ELECT REM PT RETURN 9FT ADLT (ELECTROSURGICAL) ×4
ELECTRODE REM PT RTRN 9FT ADLT (ELECTROSURGICAL) ×2 IMPLANT
GAUZE SPONGE 2X2 8PLY STRL LF (GAUZE/BANDAGES/DRESSINGS) ×2 IMPLANT
GLOVE BIO SURGEON STRL SZ 6.5 (GLOVE) ×3 IMPLANT
GLOVE BIO SURGEONS STRL SZ 6.5 (GLOVE) ×1
GLOVE BIOGEL M STRL SZ7.5 (GLOVE) ×8 IMPLANT
GOWN STRL REUS W/TWL LRG LVL3 (GOWN DISPOSABLE) ×12 IMPLANT
HEMOSTAT SURGICEL 4X8 (HEMOSTASIS) ×4 IMPLANT
HOLDER FOLEY CATH W/STRAP (MISCELLANEOUS) ×4 IMPLANT
IRRIG SUCT STRYKERFLOW 2 WTIP (MISCELLANEOUS) ×4
IRRIGATION SUCT STRKRFLW 2 WTP (MISCELLANEOUS) ×2 IMPLANT
IV LACTATED RINGERS 1000ML (IV SOLUTION) ×4 IMPLANT
LIQUID BAND (GAUZE/BANDAGES/DRESSINGS) ×4 IMPLANT
NDL SAFETY ECLIPSE 18X1.5 (NEEDLE) ×2 IMPLANT
NEEDLE HYPO 18GX1.5 SHARP (NEEDLE) ×2
PACK ROBOT UROLOGY CUSTOM (CUSTOM PROCEDURE TRAY) ×4 IMPLANT
RELOAD GREEN ECHELON 45 (STAPLE) ×4 IMPLANT
SEAL CANN UNIV 5-8 DVNC XI (MISCELLANEOUS) ×8 IMPLANT
SEAL XI 5MM-8MM UNIVERSAL (MISCELLANEOUS) ×8
SOLUTION ELECTROLUBE (MISCELLANEOUS) ×4 IMPLANT
SPONGE GAUZE 2X2 STER 10/PKG (GAUZE/BANDAGES/DRESSINGS) ×2
SUT ETHILON 3 0 PS 1 (SUTURE) ×4 IMPLANT
SUT MNCRL 3 0 RB1 (SUTURE) ×2 IMPLANT
SUT MNCRL 3 0 VIOLET RB1 (SUTURE) ×2 IMPLANT
SUT MNCRL AB 4-0 PS2 18 (SUTURE) ×8 IMPLANT
SUT MONOCRYL 3 0 RB1 (SUTURE) ×4
SUT VIC AB 0 CT1 27 (SUTURE) ×2
SUT VIC AB 0 CT1 27XBRD ANTBC (SUTURE) ×2 IMPLANT
SUT VIC AB 0 UR5 27 (SUTURE) ×4 IMPLANT
SUT VIC AB 2-0 SH 27 (SUTURE) ×2
SUT VIC AB 2-0 SH 27X BRD (SUTURE) ×2 IMPLANT
SUT VICRYL 0 UR6 27IN ABS (SUTURE) ×8 IMPLANT
SYR 27GX1/2 1ML LL SAFETY (SYRINGE) ×4 IMPLANT
TOWEL OR 17X26 10 PK STRL BLUE (TOWEL DISPOSABLE) ×4 IMPLANT
TOWEL OR NON WOVEN STRL DISP B (DISPOSABLE) ×4 IMPLANT
TUBING INSUFFLATION 10FT LAP (TUBING) IMPLANT
WATER STERILE IRR 1500ML POUR (IV SOLUTION) ×8 IMPLANT

## 2015-12-05 NOTE — Progress Notes (Signed)
Patient ID: Mario [facility], male   DOB: 09/19/1960, 55 y.o.   MRN: [id number]  Pt doing well overall.  Urine noted to be fairly red but draining.  I irrigated catheter with saline.  The catheter initially did not irrigate well.  A few small clots were removed and the catheter is now irrigating well and draining well.

## 2015-12-05 NOTE — Op Note (Signed)
Preoperative diagnosis: Clinically localized adenocarcinoma of the prostate (clinical stage T1c Nx Mx)  Postoperative diagnosis: Clinically localized adenocarcinoma of the prostate (clinical stage T1c Nx Mx)  Procedure:  1. Robotic assisted laparoscopic radical prostatectomy (bilateral nerve sparing) 2. Bilateral robotic assisted laparoscopic pelvic lymphadenectomy  Surgeon: Pryor Curia. M.D.  Assistant: Debbrah Alar, PA-C  An assistant was required for this surgical procedure.  The duties of the assistant included but were not limited to suctioning, passing suture, camera manipulation, retraction. This procedure would not be able to be performed without an Environmental consultant.  Anesthesia: General  Complications: None  EBL: 100 mL  IVF:  1300 mL crystalloid  Specimens: 1. Prostate and seminal vesicles 2. Right pelvic lymph nodes 3. Left pelvic lymph nodes  Disposition of specimens: Pathology  Drains: 1. 20 Fr coude catheter 2. # 19 Blake pelvic drain  Indication: Mario Molina is a 55 y.o. year old patient with clinically localized prostate cancer.  After a thorough review of the management options for treatment of prostate cancer, he elected to proceed with surgical therapy and the above procedure(s).  We have discussed the potential benefits and risks of the procedure, side effects of the proposed treatment, the likelihood of the patient achieving the goals of the procedure, and any potential problems that might occur during the procedure or recuperation. Informed consent has been obtained.  Description of procedure:  The patient was taken to the operating room and a general anesthetic was administered. He was given preoperative antibiotics, placed in the dorsal lithotomy position, and prepped and draped in the usual sterile fashion. Next a preoperative timeout was performed. A urethral catheter was placed into the bladder and a site was selected near the umbilicus for  placement of the camera port. This was placed using a standard open Hassan technique which allowed entry into the peritoneal cavity under direct vision and without difficulty. An 8 mm robotic port was placed and a pneumoperitoneum established. The camera was then used to inspect the abdomen and there was no evidence of any intra-abdominal injuries or other abnormalities. The remaining abdominal ports were then placed. 8 mm robotic ports were placed in the right lower quadrant, left lower quadrant, and far left lateral abdominal wall. A 5 mm port was placed in the right upper quadrant and a 12 mm port was placed in the right lateral abdominal wall for laparoscopic assistance. All ports were placed under direct vision without difficulty. The surgical cart was then docked.   Utilizing the cautery scissors, the bladder was reflected posteriorly allowing entry into the space of Retzius and identification of the endopelvic fascia and prostate. The periprostatic fat was then removed from the prostate allowing full exposure of the endopelvic fascia. The endopelvic fascia was then incised from the apex back to the base of the prostate bilaterally and the underlying levator muscle fibers were swept laterally off the prostate thereby isolating the dorsal venous complex. The dorsal vein was then stapled and divided with a 45 mm Flex Echelon stapler. Attention then turned to the bladder neck which was divided anteriorly thereby allowing entry into the bladder and exposure of the urethral catheter. The catheter balloon was deflated and the catheter was brought into the operative field and used to retract the prostate anteriorly. The posterior bladder neck was then examined.  There was a very large median intravesical lobe and right lateral intravesical lobe of the prostate.  The bladder neck was carefully divided just below these intravesical components as they  were enucleated off the bladder.  This allowed for further  dissection between the bladder and prostate posteriorly until the vasa deferentia and seminal vessels were identified. The vasa deferentia were isolated, divided, and lifted anteriorly. The seminal vesicles were dissected down to their tips with care to control the seminal vascular arterial blood supply. These structures were then lifted anteriorly and the space between Denonvillier's fascia and the anterior rectum was developed with a combination of sharp and blunt dissection. This isolated the vascular pedicles of the prostate.  The lateral prostatic fascia was then sharply incised allowing release of the neurovascular bundles bilaterally. The vascular pedicles of the prostate were then ligated with Weck clips between the prostate and neurovascular bundles and divided with sharp cold scissor dissection resulting in neurovascular bundle preservation. The neurovascular bundles were then separated off the apex of the prostate and urethra bilaterally.  The urethra was then sharply transected allowing the prostate specimen to be disarticulated. The pelvis was copiously irrigated and hemostasis was ensured. There was no evidence for rectal injury.  Attention then turned to the right pelvic sidewall. The fibrofatty tissue between the external iliac vein, confluence of the iliac vessels, hypogastric artery, and Cooper's ligament was dissected free from the pelvic sidewall with care to preserve the obturator nerve. Weck clips were used for lymphostasis and hemostasis. An identical procedure was performed on the contralateral side and the lymphatic packets were removed for permanent pathologic analysis.  Attention then turned to the urethral anastomosis. A 2-0 Vicryl slip knot was placed between Denonvillier's fascia, the posterior bladder neck, and the posterior urethra to reapproximate these structures. A double-armed 3-0 Monocryl suture was then used to perform a 360 running tension-free anastomosis between the  bladder neck and urethra. A new urethral catheter was then placed into the bladder and irrigated. There were no blood clots within the bladder and the anastomosis appeared to be watertight. Two pieces of surgicel were placed on either side of the anastomosis to assist with hemostasis. A #19 Blake drain was then brought through the left lateral 8 mm port site and positioned appropriately within the pelvis. It was secured to the skin with a nylon suture. The surgical cart was then undocked. The right lateral 12 mm port site was closed at the fascial level with a 0 Vicryl suture placed laparoscopically. All remaining ports were then removed under direct vision. The prostate specimen was removed intact within the Endopouch retrieval bag via the periumbilical camera port site. This fascial opening was closed with two running 0 Vicryl sutures. 0.25% Marcaine was then injected into all port sites and all incisions were reapproximated at the skin level with 4-0 Monocryl subcuticular sutures and Liquiband. The patient appeared to tolerate the procedure well and without complications. The patient was able to be extubated and transferred to the recovery unit in satisfactory condition.   Pryor Curia MD

## 2015-12-05 NOTE — Care Management Note (Signed)
Case Management Note  Patient Details  Name: Dekoda Franssen MRN: [id number] Date of Birth: [birth year]  Subjective/Objective: 55 y/o m admitted w/Prostate Ca, s/p lap rad prostatectomy. From home.                   Action/Plan:d/c plan home.   Expected Discharge Date:                 Expected Discharge Plan:  Home/Self Care  In-House Referral:     Discharge planning Services  CM Consult  Post Acute Care Choice:    Choice offered to:     DME Arranged:    DME Agency:     HH Arranged:    HH Agency:     Status of Service:  In process, will continue to follow  If discussed at Long Length of Stay Meetings, dates discussed:    Additional Comments:  Dessa Phi, RN 12/05/2015, 2:24 PM

## 2015-12-05 NOTE — Interval H&P Note (Signed)
History and Physical Interval Note:  12/05/2015 6:58 AM  Mario Molina  has presented today for surgery, with the diagnosis of PROSTATE CANCER  The various methods of treatment have been discussed with the patient and family. After consideration of risks, benefits and other options for treatment, the patient has consented to  Procedure(s): XI ROBOTIC ASSISTED LAPAROSCOPIC RADICAL PROSTATECTOMY LEVEL 2 (N/A) PELVIC LYMPHADENECTOMY (Bilateral) as a surgical intervention .  The patient's history has been reviewed, patient examined, no change in status, stable for surgery.  I have reviewed the patient's chart and labs.  Questions were answered to the patient's satisfaction.     Lindamarie Maclachlan,LES

## 2015-12-05 NOTE — Transfer of Care (Signed)
Immediate Anesthesia Transfer of Care Note  Patient: Mario Molina  Procedure(s) Performed: Procedure(s): XI ROBOTIC ASSISTED LAPAROSCOPIC RADICAL PROSTATECTOMY LEVEL 2 (N/A) PELVIC LYMPHADENECTOMY (Bilateral)  Patient Location: PACU  Anesthesia Type:General  Level of Consciousness: sedated  Airway & Oxygen Therapy: Patient Spontanous Breathing and Patient connected to face mask oxygen  Post-op Assessment: Report given to RN and Post -op Vital signs reviewed and stable  Post vital signs: Reviewed and stable  Last Vitals:  Vitals:   12/05/15 0531  BP: (!) 147/75  Pulse: 78  Resp: 16  Temp: 36.7 C    Last Pain:  Vitals:   12/05/15 0531  TempSrc: Oral         Complications: No apparent anesthesia complications

## 2015-12-05 NOTE — Discharge Instructions (Signed)

## 2015-12-05 NOTE — Anesthesia Procedure Notes (Signed)
Procedure Name: Intubation Date/Time: 12/05/2015 7:18 AM Performed by: Lind Covert Pre-anesthesia Checklist: Patient identified, Emergency Drugs available, Suction available, Patient being monitored and Timeout performed Patient Re-evaluated:Patient Re-evaluated prior to inductionOxygen Delivery Method: Circle system utilized Preoxygenation: Pre-oxygenation with 100% oxygen Intubation Type: IV induction Laryngoscope Size: Mac and 4 Grade View: Grade I Tube type: Oral Tube size: 7.5 mm Number of attempts: 1 Airway Equipment and Method: Stylet Secured at: 23 cm Tube secured with: Tape Dental Injury: Teeth and Oropharynx as per pre-operative assessment

## 2015-12-05 NOTE — Anesthesia Postprocedure Evaluation (Signed)
Anesthesia Post Note  Patient: Mario Molina  Procedure(s) Performed: Procedure(s) (LRB): XI ROBOTIC ASSISTED LAPAROSCOPIC RADICAL PROSTATECTOMY LEVEL 2 (N/A) PELVIC LYMPHADENECTOMY (Bilateral)  Patient location during evaluation: PACU Anesthesia Type: General Level of consciousness: awake and alert Pain management: pain level controlled Vital Signs Assessment: post-procedure vital signs reviewed and stable Respiratory status: spontaneous breathing, nonlabored ventilation, respiratory function stable and patient connected to nasal cannula oxygen Cardiovascular status: blood pressure returned to baseline and stable Postop Assessment: no signs of nausea or vomiting Anesthetic complications: no    Last Vitals:  Vitals:   12/05/15 1145 12/05/15 1154  BP: 108/69 108/69  Pulse: 62   Resp: 10   Temp:  36.2 C    Last Pain:  Vitals:   12/05/15 1154  TempSrc:   PainSc: 5                  Conard Alvira J

## 2015-12-05 NOTE — Progress Notes (Signed)
Post-op note  Subjective: The patient is doing well.  No complaints.  Denies N/V  Objective: Vital signs in last 24 hours: Temp:  [94.3 F (34.6 C)-98 F (36.7 C)] 98 F (36.7 C) (10/12 1211) Pulse Rate:  [56-78] 62 (10/12 1211) Resp:  [10-17] 10 (10/12 1145) BP: (82-147)/(49-79) 116/69 (10/12 1211) SpO2:  [99 %-100 %] 99 % (10/12 1211) Weight:  [85.5 kg (188 lb 8 oz)] 85.5 kg (188 lb 8 oz) (10/12 0618)  Intake/Output from previous day: No intake/output data recorded. Intake/Output this shift: Total I/O In: 2700 [I.V.:1700; IV Piggyback:1000] Out: 245 [Urine:100; Drains:45; Blood:100]  Physical Exam:  General: Alert and oriented. Abdomen: Soft, Nondistended. Incisions: Clean and dry. Urine: red  Lab Results:  Recent Labs  12/05/15 1051  HGB 11.7*  HCT 35.9*    Assessment/Plan: POD#0   1) Continue to monitor  2) DVT prophy, clears, IS, amb, pain control   LOS: 0 days   Akaylah Lalley 12/05/2015, 1:06 PM

## 2015-12-06 ENCOUNTER — Encounter: Payer: Self-pay | Admitting: Medical Oncology

## 2015-12-06 LAB — GLUCOSE, CAPILLARY
GLUCOSE-CAPILLARY: 83 mg/dL (ref 65–99)
GLUCOSE-CAPILLARY: 90 mg/dL (ref 65–99)
Glucose-Capillary: 103 mg/dL — ABNORMAL HIGH (ref 65–99)

## 2015-12-06 LAB — HEMOGLOBIN AND HEMATOCRIT, BLOOD
HEMATOCRIT: 34.6 % — AB (ref 39.0–52.0)
Hemoglobin: 11.5 g/dL — ABNORMAL LOW (ref 13.0–17.0)

## 2015-12-06 MED ORDER — BISACODYL 10 MG RE SUPP
10.0000 mg | Freq: Once | RECTAL | Status: AC
  Administered 2015-12-06: 10 mg via RECTAL
  Filled 2015-12-06: qty 1

## 2015-12-06 MED ORDER — HYDROCODONE-ACETAMINOPHEN 5-325 MG PO TABS: 1.0000 | ORAL_TABLET | Freq: Four times a day (QID) | ORAL | Status: DC | PRN

## 2015-12-06 NOTE — Progress Notes (Signed)
Patient ID: Mario Molina, male   DOB: [birth year], 55 y.o.   MRN: [id number]  1 Day Post-Op Subjective: The patient is doing well.  No nausea or vomiting. Pain is adequately controlled.  Objective: Vital signs in last 24 hours: Temp:  [94.3 F (34.6 C)-99.1 F (37.3 C)] 99.1 F (37.3 C) (10/13 0422) Pulse Rate:  [56-69] 59 (10/13 0422) Resp:  [10-18] 18 (10/13 0422) BP: (82-131)/(49-84) 128/73 (10/13 0422) SpO2:  [99 %-100 %] 100 % (10/13 0422)  Intake/Output from previous day: 10/12 0701 - 10/13 0700 In: 6042.5 [P.O.:860; I.V.:4132.5; IV Piggyback:1050] Out: 2195 [Urine:1900; Drains:195; Blood:100] Intake/Output this shift: Total I/O In: 20 [Other:20] Out: -   Physical Exam:  General: Alert and oriented. CV: RRR Lungs: Clear bilaterally. GI: Soft, Nondistended. Incisions: Clean, dry, and intact Urine: Clear Extremities: Nontender, no erythema, no edema.  Lab Results:  Recent Labs  12/05/15 1051 12/06/15 0552  HGB 11.7* 11.5*  HCT 35.9* 34.6*      Assessment/Plan: POD# 1 s/p robotic prostatectomy.  1) SL IVF 2) Ambulate, Incentive spirometry 3) Transition to oral pain medication 4) Dulcolax suppository 5) D/C pelvic drain 6) Plan for likely discharge later today   Pryor Curia. MD   LOS: 1 day   Jemila Camille,LES 12/06/2015, 7:16 AM

## 2015-12-06 NOTE — Progress Notes (Signed)
Pt up ambulating independently in hallway.  Tolerating clear liquid diet.  Pt has required no PRN medication today. Reports feeling "bloated but not really any pain".  Pt and wife verbalized comfort with foley/leg bag teaching.  D/C instructions reviewed w/ pt and wife. Both verbalize understanding and all questions answered. Pt in possession of d/c instructions, scripts, and all personal belongings. Wife to get her allergy shot and will be back to p/u pt.

## 2015-12-06 NOTE — Discharge Summary (Signed)
  Date of admission: 12/05/2015  Date of discharge: 12/06/2015  Admission diagnosis: Prostate Cancer  Discharge diagnosis: Prostate Cancer  History and Physical: For full details, please see admission history and physical. Briefly, Mario Molina is a 54 y.o. gentleman with localized prostate cancer.  After discussing management/treatment options, he elected to proceed with surgical treatment.  Hospital Course: Mario Molina was taken to the operating room on 12/05/2015 and underwent a robotic assisted laparoscopic radical prostatectomy. He tolerated this procedure well and without complications. Postoperatively, he was able to be transferred to a regular hospital room following recovery from anesthesia.  He was able to begin ambulating the night of surgery. He remained hemodynamically stable overnight.  He had excellent urine output with appropriately minimal output from his pelvic drain and his pelvic drain was removed on POD #1.  He was transitioned to oral pain medication, tolerated a clear liquid diet, and had met all discharge criteria and was able to be discharged home later on POD#1.  Laboratory values:  Recent Labs  12/05/15 1051 12/06/15 0552  HGB 11.7* 11.5*  HCT 35.9* 34.6*    Disposition: Home  Discharge instruction: He was instructed to be ambulatory but to refrain from heavy lifting, strenuous activity, or driving. He was instructed on urethral catheter care.  Discharge medications:    Medication List    TAKE these medications   HYDROcodone-acetaminophen 5-325 MG tablet Commonly known as:  NORCO Take 1-2 tablets by mouth every 6 (six) hours as needed for moderate pain or severe pain.   metFORMIN 500 MG tablet Commonly known as:  GLUCOPHAGE Take 500 mg by mouth daily with breakfast.   olmesartan-hydrochlorothiazide 40-25 MG tablet Commonly known as:  BENICAR HCT Take 1 tablet by mouth daily.   sulfamethoxazole-trimethoprim 800-160 MG tablet Commonly known as:   BACTRIM DS,SEPTRA DS Take 1 tablet by mouth 2 (two) times daily. Start the day prior to foley removal appointment       Followup: He will followup in 1 week for catheter removal and to discuss his surgical pathology results.

## 2016-02-04 ENCOUNTER — Telehealth: Payer: Self-pay | Admitting: Medical Oncology

## 2016-02-04 NOTE — Progress Notes (Signed)
Mr. Beahan post prostatectomy 12/05/15. He states he is doing well. He continues to do his exercises to regain continence but states that is going well. He continues follow up with Dr. Alinda Money.

## 2017-06-02 ENCOUNTER — Other Ambulatory Visit: Payer: Self-pay | Admitting: Urology

## 2017-06-22 NOTE — Patient Instructions (Addendum)
Tedrick Port  06/22/2017   Your procedure is scheduled on:  Thursday 07/01/2017  Report to [facility] Main  Entrance              Report to admitting at  1215 PM    Call this number if you have problems the morning of surgery [phone]      Remember: No solid foodsz:After Midnight.You may have clear liquids from midnight up until  0815 am then nothing until after surgery!     CLEAR LIQUID DIET   Foods Allowed                                                                     Foods Excluded  Coffee and tea, regular and decaf                             liquids that you cannot  Plain Jell-O in any flavor                                             see through such as: Fruit ices (not with fruit pulp)                                     milk, soups, orange juice  Iced Popsicles                                    All solid food Carbonated beverages, regular and diet                                    Cranberry, grape and apple juices Sports drinks like Gatorade Lightly seasoned clear broth or consume(fat free) Sugar, honey syrup  Sample Menu Breakfast                                Lunch                                     Supper Cranberry juice                    Beef broth                            Chicken broth Jell-O                                     Grape juice  Apple juice Coffee or tea                        Jell-O                                      Popsicle                                                Coffee or tea                        Coffee or tea  _____________________________________________________________________ How to Manage Your Diabetes Before and After Surgery  Why is it important to control my blood sugar before and after surgery? . Improving blood sugar levels before and after surgery helps healing and can limit problems. . A way of improving blood sugar control is eating a healthy diet  by: o  Eating less sugar and carbohydrates o  Increasing activity/exercise o  Talking with your doctor about reaching your blood sugar goals . High blood sugars (greater than 180 mg/dL) can raise your risk of infections and slow your recovery, so you will need to focus on controlling your diabetes during the weeks before surgery. . Make sure that the doctor who takes care of your diabetes knows about your planned surgery including the date and location.  How do I manage my blood sugar before surgery? . Check your blood sugar at least 4 times a day, starting 2 days before surgery, to make sure that the level is not too high or low. o Check your blood sugar the morning of your surgery when you wake up and every 2 hours until you get to the Short Stay unit. . If your blood sugar is less than 70 mg/dL, you will need to treat for low blood sugar: o Do not take insulin. o Treat a low blood sugar (less than 70 mg/dL) with  cup of clear juice (cranberry or apple), 4 glucose tablets, OR glucose gel. o Recheck blood sugar in 15 minutes after treatment (to make sure it is greater than 70 mg/dL). If your blood sugar is not greater than 70 mg/dL on recheck, call [phone] for further instructions. . Report your blood sugar to the short stay nurse when you get to Short Stay.  . If you are admitted to the hospital after surgery: o Your blood sugar will be checked by the staff and you will probably be given insulin after surgery (instead of oral diabetes medicines) to make sure you have good blood sugar levels. o The goal for blood sugar control after surgery is 80-180 mg/dL.   WHAT DO I DO ABOUT MY DIABETES MEDICATION?        The day before surgery, take Metformin as usual.   . Do not take oral diabetes medicines (pills) the morning of surgery.    Take these medicines the morning of surgery with A SIP OF WATER: none               DO NOT TAKE ANY DIABETIC MEDICATIONS DAY OF YOUR SURGERY  You may not have any metal on your body including hair pins and              piercings  Do not wear jewelry, make-up, lotions, powders or perfumes, deodorant                        Men may shave face and neck.   Do not bring valuables to the hospital. [place].  Contacts, dentures or bridgework may not be worn into surgery.  Leave suitcase in the car. After surgery it may be brought to your room.     Patients discharged the day of surgery will not be allowed to drive home.  Name and phone number of your driver:spouse-Sheila  Special Instructions: N/A              Please read over the following fact sheets you were given: _____________________________________________________________________             [facility] - Preparing for Surgery Before surgery, you can play an important role.  Because skin is not sterile, your skin needs to be as free of germs as possible.  You can reduce the number of germs on your skin by washing with CHG (chlorahexidine gluconate) soap before surgery.  CHG is an antiseptic cleaner which kills germs and bonds with the skin to continue killing germs even after washing. Please DO NOT use if you have an allergy to CHG or antibacterial soaps.  If your skin becomes reddened/irritated stop using the CHG and inform your nurse when you arrive at Short Stay. Do not shave (including legs and underarms) for at least 48 hours prior to the first CHG shower.  You may shave your face/neck. Please follow these instructions carefully:  1.  Shower with CHG Soap the night before surgery and the  morning of Surgery.  2.  If you choose to wash your hair, wash your hair first as usual with your  normal  shampoo.  3.  After you shampoo, rinse your hair and body thoroughly to remove the  shampoo.                           4.  Use CHG as you would any other liquid soap.  You can apply chg directly  to the skin and  wash                       Gently with a scrungie or clean washcloth.  5.  Apply the CHG Soap to your body ONLY FROM THE NECK DOWN.   Do not use on face/ open                           Wound or open sores. Avoid contact with eyes, ears mouth and genitals (private parts).                       Wash face,  Genitals (private parts) with your normal soap.             6.  Wash thoroughly, paying special attention to the area where your surgery  will be performed.  7.  Thoroughly rinse your body with warm water from the neck down.  8.  DO  NOT shower/wash with your normal soap after using and rinsing off  the CHG Soap.                9.  Pat yourself dry with a clean towel.            10.  Wear clean pajamas.            11.  Place clean sheets on your bed the night of your first shower and do not  sleep with pets. Day of Surgery : Do not apply any lotions/deodorants the morning of surgery.  Please wear clean clothes to the hospital/surgery center.  FAILURE TO FOLLOW THESE INSTRUCTIONS MAY RESULT IN THE CANCELLATION OF YOUR SURGERY PATIENT SIGNATURE_________________________________  NURSE SIGNATURE__________________________________  ________________________________________________________________________

## 2017-06-24 ENCOUNTER — Encounter (HOSPITAL_COMMUNITY): Payer: Self-pay

## 2017-06-24 ENCOUNTER — Encounter (HOSPITAL_COMMUNITY)
Admission: RE | Admit: 2017-06-24 | Discharge: 2017-06-24 | Disposition: A | Discharge status: Home / Self Care | Payer: BC Managed Care – PPO | Source: Ambulatory Visit | Attending: Urology | Admitting: Urology

## 2017-06-24 ENCOUNTER — Other Ambulatory Visit: Payer: Self-pay

## 2017-06-24 DIAGNOSIS — Z01812 Encounter for preprocedural laboratory examination: Secondary | ICD-10-CM | POA: Diagnosis present

## 2017-06-24 DIAGNOSIS — R001 Bradycardia, unspecified: Secondary | ICD-10-CM | POA: Diagnosis not present

## 2017-06-24 DIAGNOSIS — Z01810 Encounter for preprocedural cardiovascular examination: Secondary | ICD-10-CM | POA: Diagnosis present

## 2017-06-24 LAB — HEMOGLOBIN A1C
Hgb A1c MFr Bld: 6.3 % — ABNORMAL HIGH (ref 4.8–5.6)
Mean Plasma Glucose: 134.11 mg/dL

## 2017-06-24 LAB — BASIC METABOLIC PANEL
Anion gap: 10 (ref 5–15)
BUN: 24 mg/dL — ABNORMAL HIGH (ref 6–20)
CALCIUM: 9.6 mg/dL (ref 8.9–10.3)
CO2: 24 mmol/L (ref 22–32)
Chloride: 105 mmol/L (ref 101–111)
Creatinine, Ser: 1.09 mg/dL (ref 0.61–1.24)
GFR calc Af Amer: >60 mL/min (ref >60)
GLUCOSE: 115 mg/dL — AB (ref 65–99)
Potassium: 4.1 mmol/L (ref 3.5–5.1)
Sodium: 139 mmol/L (ref 135–145)

## 2017-06-24 LAB — CBC
HEMATOCRIT: 39.1 % (ref 39.0–52.0)
Hemoglobin: 12.9 g/dL — ABNORMAL LOW (ref 13.0–17.0)
MCH: 21.8 pg — ABNORMAL LOW (ref 26.0–34.0)
MCHC: 33 g/dL (ref 30.0–36.0)
MCV: 65.9 fL — AB (ref 78.0–100.0)
PLATELETS: 261 10*3/uL (ref 150–400)
RBC: 5.93 MIL/uL — AB (ref 4.22–5.81)
RDW: 15.6 % — AB (ref 11.5–15.5)
WBC: 6.9 10*3/uL (ref 4.0–10.5)

## 2017-06-24 LAB — GLUCOSE, CAPILLARY: GLUCOSE-CAPILLARY: 121 mg/dL — AB (ref 65–99)

## 2017-06-30 NOTE — H&P (Signed)
CC/HPI:  1. Prostate cancer  2. Incontinence  3. Possible urethral foreign body   Mario Molina returns today after having undergone a recent evaluation by Dr. Matilde Sprang for his incontinence. On cystoscopy, he was apparently noted to have a foreign body within the urethra that may represent a migrated staple. He has denied any hematuria, dysuria, or passage of any stones. Dr. Matilde Sprang recommended that he proceed with further evaluation and have this addressed prior to any further evaluation for his incontinence.     ALLERGIES: No Allergies    MEDICATIONS: Myrbetriq 50 mg tablet, extended release 24 hr 1 tablet PO Daily  Sildenafil 20 mg tablet 1-5 tablet PO PRN as directed  Benicar 40 mg tablet Oral  Metformin Hcl 1,000 mg tablet Oral     Notes: PA for cialis 5mg  sent to [facility] 12/12/15 hs-cma   GU PSH: Complex cystometrogram, w/ void pressure and urethral pressure profile studies, any technique - 05/19/2017 Complex Uroflow - 05/19/2017 Cystoscopy - 05/26/2017 Emg surf Electrd - 05/19/2017 Inject For cystogram - 05/19/2017 Intrabd voidng Press - 05/19/2017 Laparoscopy; Lymphadenectomy - 12/05/2015 Prostate Needle Biopsy - 10/14/2015 Robotic Radical Prostatectomy - 12/05/2015      PSH Notes: Cholecystectomy   NON-GU PSH: Cholecystectomy (open) - 2017 Surgical Pathology, Gross And Microscopic Examination For Prostate Needle - 10/14/2015    GU PMH: Mixed incontinence - 05/12/2017 Nocturia - 05/12/2017 Urinary Frequency - 05/12/2017 ED due to arterial insufficiency - 03/27/2016 Stress Incontinence - 03/27/2016 Prostate Cancer, Prostate cancer - 06/10/2015      PMH Notes:   1) Prostate cancer: He is s/p a BNS RAL radical prostatectomy and BPLND on 12/05/15.   Diagnosis: pT2c N0 Mx, Gleason 3+4=7 adenocarcinoma with negative surgical margins  Pretreatment PSA: 5.11  Pretreatment SHIM: 23   NON-GU PMH: Muscle weakness (generalized) - 10/27/2016 Diabetes Type  2 Hypercholesterolemia Hypertension    FAMILY HISTORY: malignant neoplasm of prostate - Runs In Family sickle cell anemia - Runs In Family   SOCIAL HISTORY: Marital Status: Married Preferred Language: English; Ethnicity: Not Hispanic Or Latino; Race: White Current Smoking Status: Patient has never smoked.   Tobacco Use Assessment Completed: Used Tobacco in last 30 days? Does drink.  Drinks 1 caffeinated drink per day.     Notes: Alcohol use, Married, Mother alive and healthy, Father alive and healthy, Number of children, Never a smoker   REVIEW OF SYSTEMS:    GU Review Male:   Patient reports leakage of urine. Patient denies frequent urination, hard to postpone urination, burning/ pain with urination, get up at night to urinate, stream starts and stops, trouble starting your streams, and have to strain to urinate .  Gastrointestinal (Upper):   Patient denies nausea and vomiting.  Gastrointestinal (Lower):   Patient denies diarrhea and constipation.  Constitutional:   Patient denies fever, night sweats, weight loss, and fatigue.  Skin:   Patient denies skin rash/ lesion and itching.  Eyes:   Patient denies blurred vision and double vision.  Ears/ Nose/ Throat:   Patient denies sore throat and sinus problems.  Hematologic/Lymphatic:   Patient denies swollen glands and easy bruising.  Cardiovascular:   Patient denies leg swelling and chest pains.  Respiratory:   Patient denies cough and shortness of breath.  Endocrine:   Patient denies excessive thirst.  Musculoskeletal:   Patient denies back pain and joint pain.  Neurological:   Patient denies headaches and dizziness.  Psychologic:   Patient denies depression and anxiety.   VITAL  SIGNS:       Weight 200 lb / 90.72 kg  Height 69 in / 175.26 cm  BMI 29.5 kg/m   MULTI-SYSTEM PHYSICAL EXAMINATION:    Constitutional: Well-nourished. No physical deformities. Normally developed. Good grooming.  Respiratory: No labored breathing, no  use of accessory muscles. Normal breath sounds.  Cardiovascular: Regular rate and rhythm. No murmur, no gallop. Normal temperature, normal extremity pulses, no swelling, no varicosities.         ASSESSMENT:      ICD-10 Details  1 GU:   Mixed incontinence - N39.46    PLAN:     Incontinence: I will speak with Dr. Matilde Sprang to ensure I understand his finding on cystoscopy. Most likely, this would indicate a staple from the dorsal vein complex at his migrated into the urethra. I therefore have discussed with Mario Molina about proceeding with cystoscopy and endoscopic removal of this foreign body if confirmed with Dr. Matilde Sprang. We discussed this procedure in detail including the potential risks, complications, and the expected recovery process. This will be schedule at his convenience.

## 2017-07-01 ENCOUNTER — Encounter (HOSPITAL_COMMUNITY)
Admission: RE | Disposition: A | Discharge status: Home / Self Care | Payer: Self-pay | Source: Ambulatory Visit | Attending: Urology

## 2017-07-01 ENCOUNTER — Encounter (HOSPITAL_COMMUNITY): Payer: Self-pay | Admitting: Emergency Medicine

## 2017-07-01 ENCOUNTER — Ambulatory Visit (HOSPITAL_COMMUNITY): Payer: BC Managed Care – PPO | Admitting: Certified Registered Nurse Anesthetist

## 2017-07-01 ENCOUNTER — Ambulatory Visit (HOSPITAL_COMMUNITY)
Admission: RE | Admit: 2017-07-01 | Discharge: 2017-07-01 | Disposition: A | Discharge status: Home / Self Care | Payer: BC Managed Care – PPO | Source: Ambulatory Visit | Attending: Urology | Admitting: Urology

## 2017-07-01 DIAGNOSIS — Z79899 Other long term (current) drug therapy: Secondary | ICD-10-CM | POA: Insufficient documentation

## 2017-07-01 DIAGNOSIS — N210 Calculus in bladder: Secondary | ICD-10-CM | POA: Insufficient documentation

## 2017-07-01 DIAGNOSIS — Z7984 Long term (current) use of oral hypoglycemic drugs: Secondary | ICD-10-CM | POA: Insufficient documentation

## 2017-07-01 DIAGNOSIS — E119 Type 2 diabetes mellitus without complications: Secondary | ICD-10-CM | POA: Insufficient documentation

## 2017-07-01 DIAGNOSIS — T190XXA Foreign body in urethra, initial encounter: Secondary | ICD-10-CM | POA: Insufficient documentation

## 2017-07-01 DIAGNOSIS — Z8546 Personal history of malignant neoplasm of prostate: Secondary | ICD-10-CM | POA: Diagnosis not present

## 2017-07-01 DIAGNOSIS — R32 Unspecified urinary incontinence: Secondary | ICD-10-CM | POA: Diagnosis present

## 2017-07-01 DIAGNOSIS — Y838 Other surgical procedures as the cause of abnormal reaction of the patient, or of later complication, without mention of misadventure at the time of the procedure: Secondary | ICD-10-CM | POA: Insufficient documentation

## 2017-07-01 DIAGNOSIS — I10 Essential (primary) hypertension: Secondary | ICD-10-CM | POA: Insufficient documentation

## 2017-07-01 DIAGNOSIS — N5201 Erectile dysfunction due to arterial insufficiency: Secondary | ICD-10-CM | POA: Diagnosis not present

## 2017-07-01 DIAGNOSIS — N3946 Mixed incontinence: Secondary | ICD-10-CM | POA: Diagnosis not present

## 2017-07-01 HISTORY — PX: CYSTOSCOPY: SHX5120

## 2017-07-01 LAB — GLUCOSE, CAPILLARY
Glucose-Capillary: 115 mg/dL — ABNORMAL HIGH (ref 65–99)
Glucose-Capillary: 122 mg/dL — ABNORMAL HIGH (ref 65–99)

## 2017-07-01 SURGERY — CYSTOSCOPY
Anesthesia: General

## 2017-07-01 MED ORDER — DEXAMETHASONE SODIUM PHOSPHATE 10 MG/ML IJ SOLN
INTRAMUSCULAR | Status: DC | PRN
  Administered 2017-07-01: 10 mg via INTRAVENOUS

## 2017-07-01 MED ORDER — HYDROMORPHONE HCL 1 MG/ML IJ SOLN: 0.2500 mg | INTRAMUSCULAR | Status: DC | PRN

## 2017-07-01 MED ORDER — ONDANSETRON HCL 4 MG/2ML IJ SOLN
INTRAMUSCULAR | Status: DC | PRN
  Administered 2017-07-01: 4 mg via INTRAVENOUS

## 2017-07-01 MED ORDER — PROPOFOL 10 MG/ML IV BOLUS
INTRAVENOUS | Status: DC | PRN
  Administered 2017-07-01: 200 mg via INTRAVENOUS

## 2017-07-01 MED ORDER — CEFAZOLIN SODIUM-DEXTROSE 2-4 GM/100ML-% IV SOLN
2.0000 g | INTRAVENOUS | Status: AC
  Administered 2017-07-01: 2 g via INTRAVENOUS
  Filled 2017-07-01: qty 100

## 2017-07-01 MED ORDER — LIDOCAINE 2% (20 MG/ML) 5 ML SYRINGE
INTRAMUSCULAR | Status: DC | PRN
  Administered 2017-07-01: 100 mg via INTRAVENOUS

## 2017-07-01 MED ORDER — LACTATED RINGERS IV SOLN
INTRAVENOUS | Status: DC
  Administered 2017-07-01: 11:00:00 via INTRAVENOUS

## 2017-07-01 MED ORDER — SODIUM CHLORIDE 0.9 % IR SOLN
Status: DC | PRN
  Administered 2017-07-01: 3000 mL via INTRAVESICAL

## 2017-07-01 MED ORDER — LIDOCAINE 2% (20 MG/ML) 5 ML SYRINGE
INTRAMUSCULAR | Status: AC
  Filled 2017-07-01: qty 5

## 2017-07-01 MED ORDER — FENTANYL CITRATE (PF) 100 MCG/2ML IJ SOLN
INTRAMUSCULAR | Status: DC | PRN
  Administered 2017-07-01 (×2): 50 ug via INTRAVENOUS

## 2017-07-01 MED ORDER — FENTANYL CITRATE (PF) 100 MCG/2ML IJ SOLN
INTRAMUSCULAR | Status: AC
  Filled 2017-07-01: qty 2

## 2017-07-01 MED ORDER — PROPOFOL 10 MG/ML IV BOLUS
INTRAVENOUS | Status: AC
  Filled 2017-07-01: qty 20

## 2017-07-01 MED ORDER — PHENAZOPYRIDINE HCL 200 MG PO TABS: 200.0000 mg | ORAL_TABLET | Freq: Three times a day (TID) | ORAL | 0 refills | Status: DC | PRN

## 2017-07-01 SURGICAL SUPPLY — 12 items
BAG URO CATCHER STRL LF (MISCELLANEOUS) ×3 IMPLANT
CATH INTERMIT  6FR 70CM (CATHETERS) IMPLANT
CLOTH BEACON ORANGE TIMEOUT ST (SAFETY) IMPLANT
COVER FOOTSWITCH UNIV (MISCELLANEOUS) IMPLANT
COVER SURGICAL LIGHT HANDLE (MISCELLANEOUS) IMPLANT
GLOVE BIOGEL M STRL SZ7.5 (GLOVE) ×3 IMPLANT
GOWN STRL REUS W/TWL LRG LVL3 (GOWN DISPOSABLE) ×3 IMPLANT
GUIDEWIRE STR DUAL SENSOR (WIRE) IMPLANT
MANIFOLD NEPTUNE II (INSTRUMENTS) ×3 IMPLANT
PACK CYSTO (CUSTOM PROCEDURE TRAY) ×3 IMPLANT
TUBING CONNECTING 10 (TUBING) ×2 IMPLANT
TUBING CONNECTING 10' (TUBING) ×1

## 2017-07-01 NOTE — Anesthesia Procedure Notes (Signed)
Procedure Name: LMA Insertion Date/Time: 07/01/2017 1:06 PM Performed by: Mitzie Na, CRNA Pre-anesthesia Checklist: Patient identified, Emergency Drugs available, Suction available and Patient being monitored Patient Re-evaluated:Patient Re-evaluated prior to induction Oxygen Delivery Method: Circle system utilized Preoxygenation: Pre-oxygenation with 100% oxygen Induction Type: IV induction Ventilation: Mask ventilation without difficulty LMA: LMA with gastric port inserted LMA Size: 4.0 Number of attempts: 1 Dental Injury: Teeth and Oropharynx as per pre-operative assessment

## 2017-07-01 NOTE — Op Note (Signed)
Preoperative diagnosis: Bladder neck calculus  Postoperative diagnosis: Bladder neck calculus, foreign body in urethra  Procedure: Cystoscopy with removal of bladder neck calculus and foreign body  Surgeon: Pryor Curia MD  Anesthesia: General  EBL: Minimal  Complications: None  Indication: Mr. Mario Molina is a 57 year old gentleman with a history of prostate cancer status post radical prostatectomy.  He was recently evaluated for post prostatectomy incontinence and possible surgical intervention.  On cystoscopy, he was incidentally noted to have a calcification located on the anterior aspect of the bladder neck.  He was referred to me for further evaluation and I recommended proceeding with cystoscopy under anesthesia with removal of this calculus along with any underlying foreign body that may be present.  We reviewed the potential risks, complications, and the expected recovery process.  He gave informed consent.  Description of procedure the patient was taken to the operating room and a general anesthetic was administered.  He was given preoperative antibiotics, placed: In the dorsal lithotomy position, and prepped and draped in the usual sterile fashion.  A preoperative timeout was performed.  Cystourethroscopy was then performed with a 23 French cystoscope.  Inspection of the bladder with both a 30 degree and 70 degree lens revealed the ureteral orifice ease to be in the expected anatomic location.  There were effluxing clear urine.  No bladder tumors, stones, or other mucosal pathology was noted within the bladder.  The cystoscope was then withdrawn to the bladder neck and this was examined circumferentially.  With the 30 degree lens, a calculus was noted anteriorly.  It was apparent that there was an underlying staple likely at the urethral anastomosis from his radical prostatectomy.  Using a flexible grasper, this was able to be removed without difficulty.  Reinspection of the bladder  neck with a 70 degree lens revealed no remaining abnormalities and no significant bleeding.  The patient's bladder was emptied and the procedure was ended.  He tolerated the procedure well without complications.  He was able to be awakened and transferred to the recovery unit in satisfactory condition.

## 2017-07-01 NOTE — Anesthesia Preprocedure Evaluation (Signed)
Anesthesia Evaluation  Patient identified by MRN, date of birth, ID band Patient awake    Reviewed: Allergy & Precautions, NPO status , Patient's Chart, lab work & pertinent test results  Airway Mallampati: II  TM Distance: >3 FB     Dental   Pulmonary neg pulmonary ROS,    breath sounds clear to auscultation       Cardiovascular hypertension,  Rhythm:Regular Rate:Normal     Neuro/Psych    GI/Hepatic negative GI ROS, Neg liver ROS,   Endo/Other  diabetes  Renal/GU      Musculoskeletal   Abdominal   Peds  Hematology   Anesthesia Other Findings   Reproductive/Obstetrics                             Anesthesia Physical Anesthesia Plan  ASA: III  Anesthesia Plan: General   Post-op Pain Management:    Induction: Intravenous  PONV Risk Score and Plan:   Airway Management Planned: LMA  Additional Equipment:   Intra-op Plan:   Post-operative Plan: Extubation in OR  Informed Consent: I have reviewed the patients History and Physical, chart, labs and discussed the procedure including the risks, benefits and alternatives for the proposed anesthesia with the patient or authorized representative who has indicated his/her understanding and acceptance.   Dental advisory given  Plan Discussed with: CRNA and Anesthesiologist  Anesthesia Plan Comments:         Anesthesia Quick Evaluation

## 2017-07-01 NOTE — Anesthesia Postprocedure Evaluation (Signed)
Anesthesia Post Note  Patient: Mario Molina  Procedure(s) Performed: CYSTOSCOPY/ REMOVAL OF FOREIGN BODY OF URETHRA (N/A )     Patient location during evaluation: PACU Anesthesia Type: General Level of consciousness: awake Pain management: pain level controlled Respiratory status: spontaneous breathing Cardiovascular status: stable Anesthetic complications: no    Last Vitals:  Vitals:   07/01/17 1414 07/01/17 1430  BP: 133/89 (!) 123/92  Pulse: 66 61  Resp: 14 16  Temp:  (!) 36.1 C  SpO2: 98% 100%    Last Pain:  Vitals:   07/01/17 1430  TempSrc:   PainSc: 0-No pain                 Quayshawn Nin

## 2017-07-01 NOTE — Transfer of Care (Signed)
Immediate Anesthesia Transfer of Care Note  Patient: Mario Molina  Procedure(s) Performed: CYSTOSCOPY/ REMOVAL OF FOREIGN BODY OF URETHRA (N/A )  Patient Location: PACU  Anesthesia Type:General  Level of Consciousness: sedated  Airway & Oxygen Therapy: Patient Spontanous Breathing and Patient connected to face mask oxygen  Post-op Assessment: Report given to RN and Post -op Vital signs reviewed and stable  Post vital signs: Reviewed and stable  Last Vitals:  Vitals Value Taken Time  BP 121/82 07/01/2017  1:32 PM  Temp    Pulse 65 07/01/2017  1:33 PM  Resp 18 07/01/2017  1:33 PM  SpO2 100 % 07/01/2017  1:33 PM  Vitals shown include unvalidated device data.  Last Pain:  Vitals:   07/01/17 1109  TempSrc:   PainSc: 0-No pain      Patients Stated Pain Goal: 4 (24/23/53 6144)  Complications: No apparent anesthesia complications

## 2017-07-01 NOTE — Discharge Instructions (Signed)
1. You may see some blood in the urine and may have some burning with urination for 48-72 hours. You also may notice that you have to urinate more frequently or urgently after your procedure which is normal.  °2. You should call should you develop an inability urinate, fever > 101, persistent nausea and vomiting that prevents you from eating or drinking to stay hydrated.  °

## 2017-08-13 IMAGING — CR DG CHEST 2V
2 series · 2 of 2 positions shown · non-contrast
Comparison: No recent prior.

CLINICAL DATA: Prostatectomy.

EXAM:
CHEST  2 VIEW

[w chest pa]
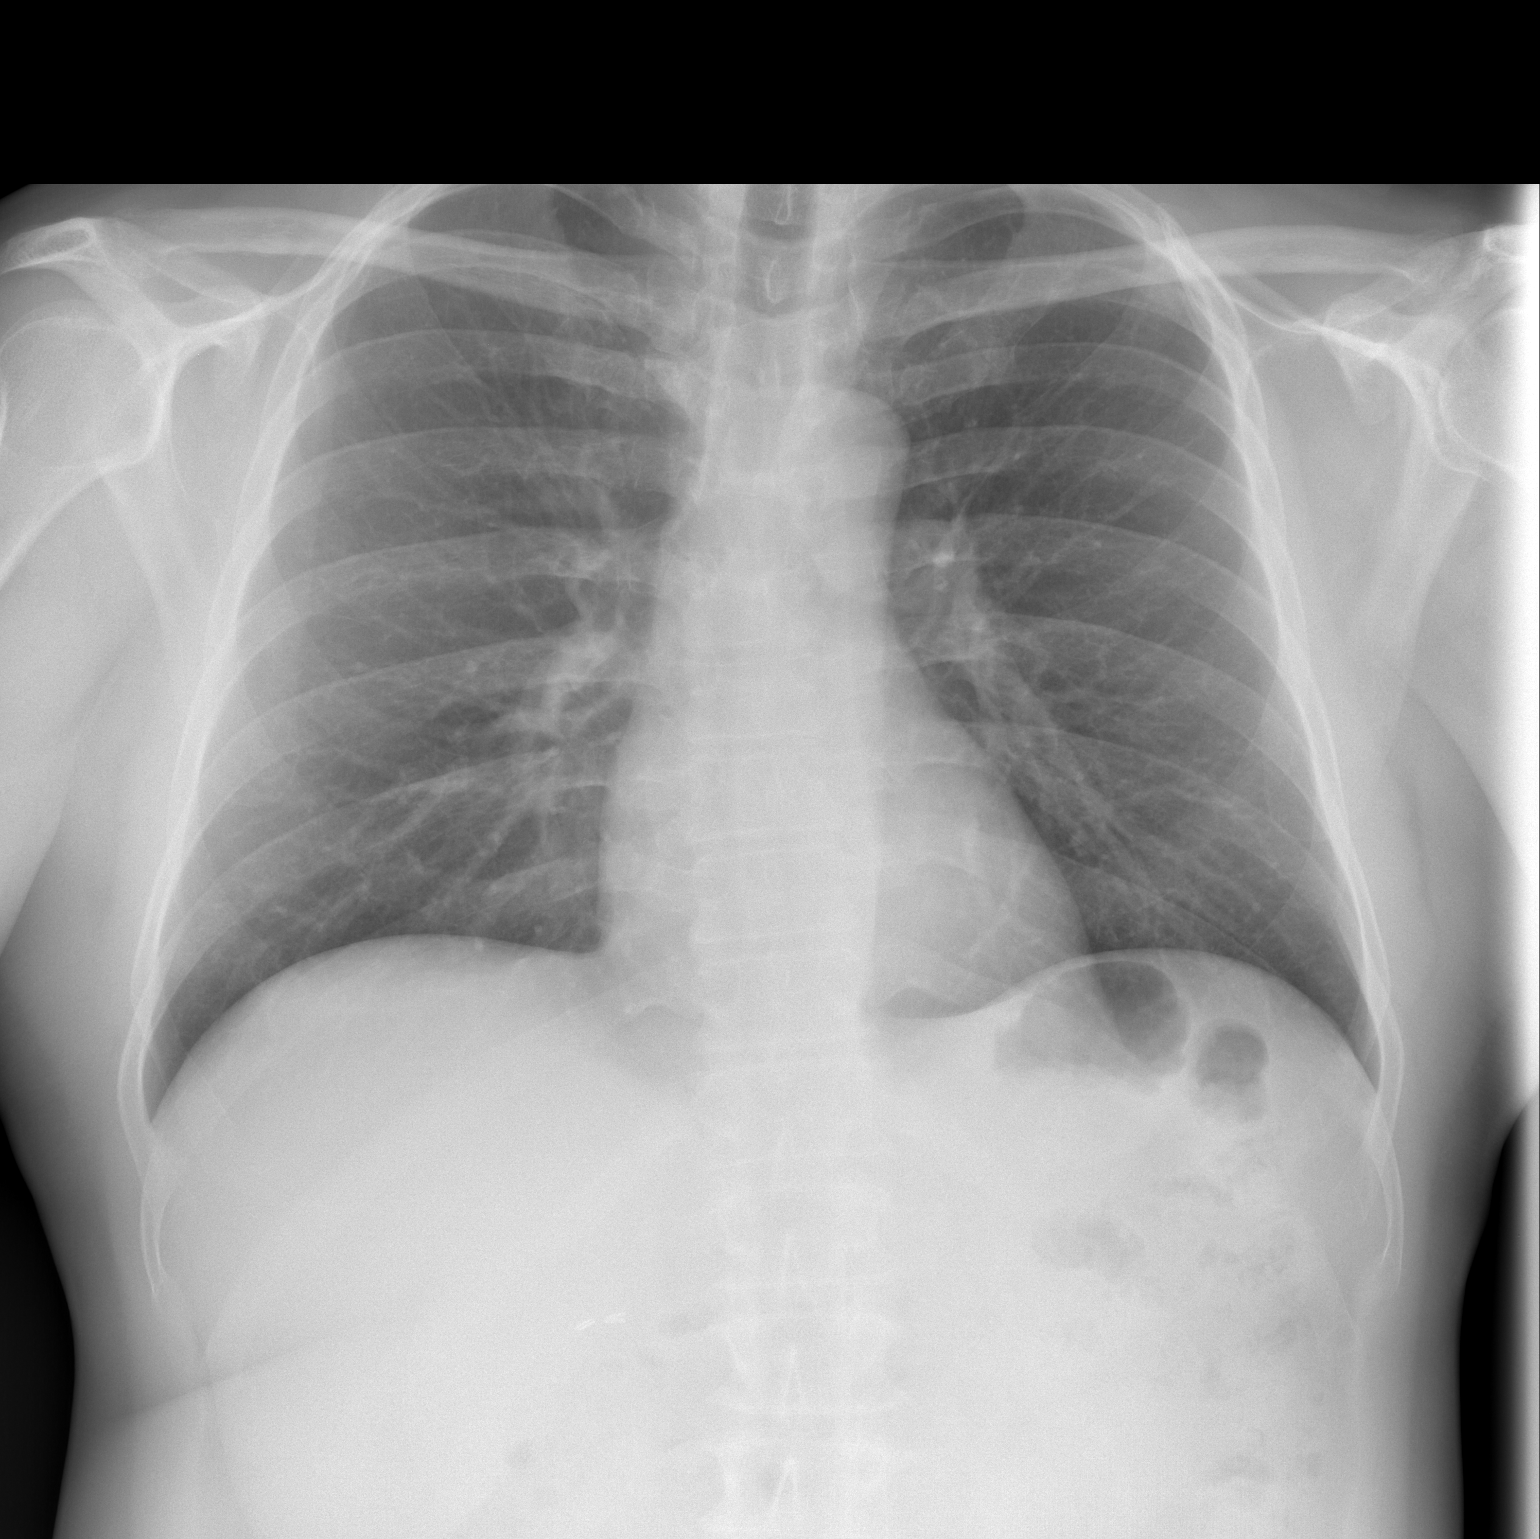

[w chest lat]
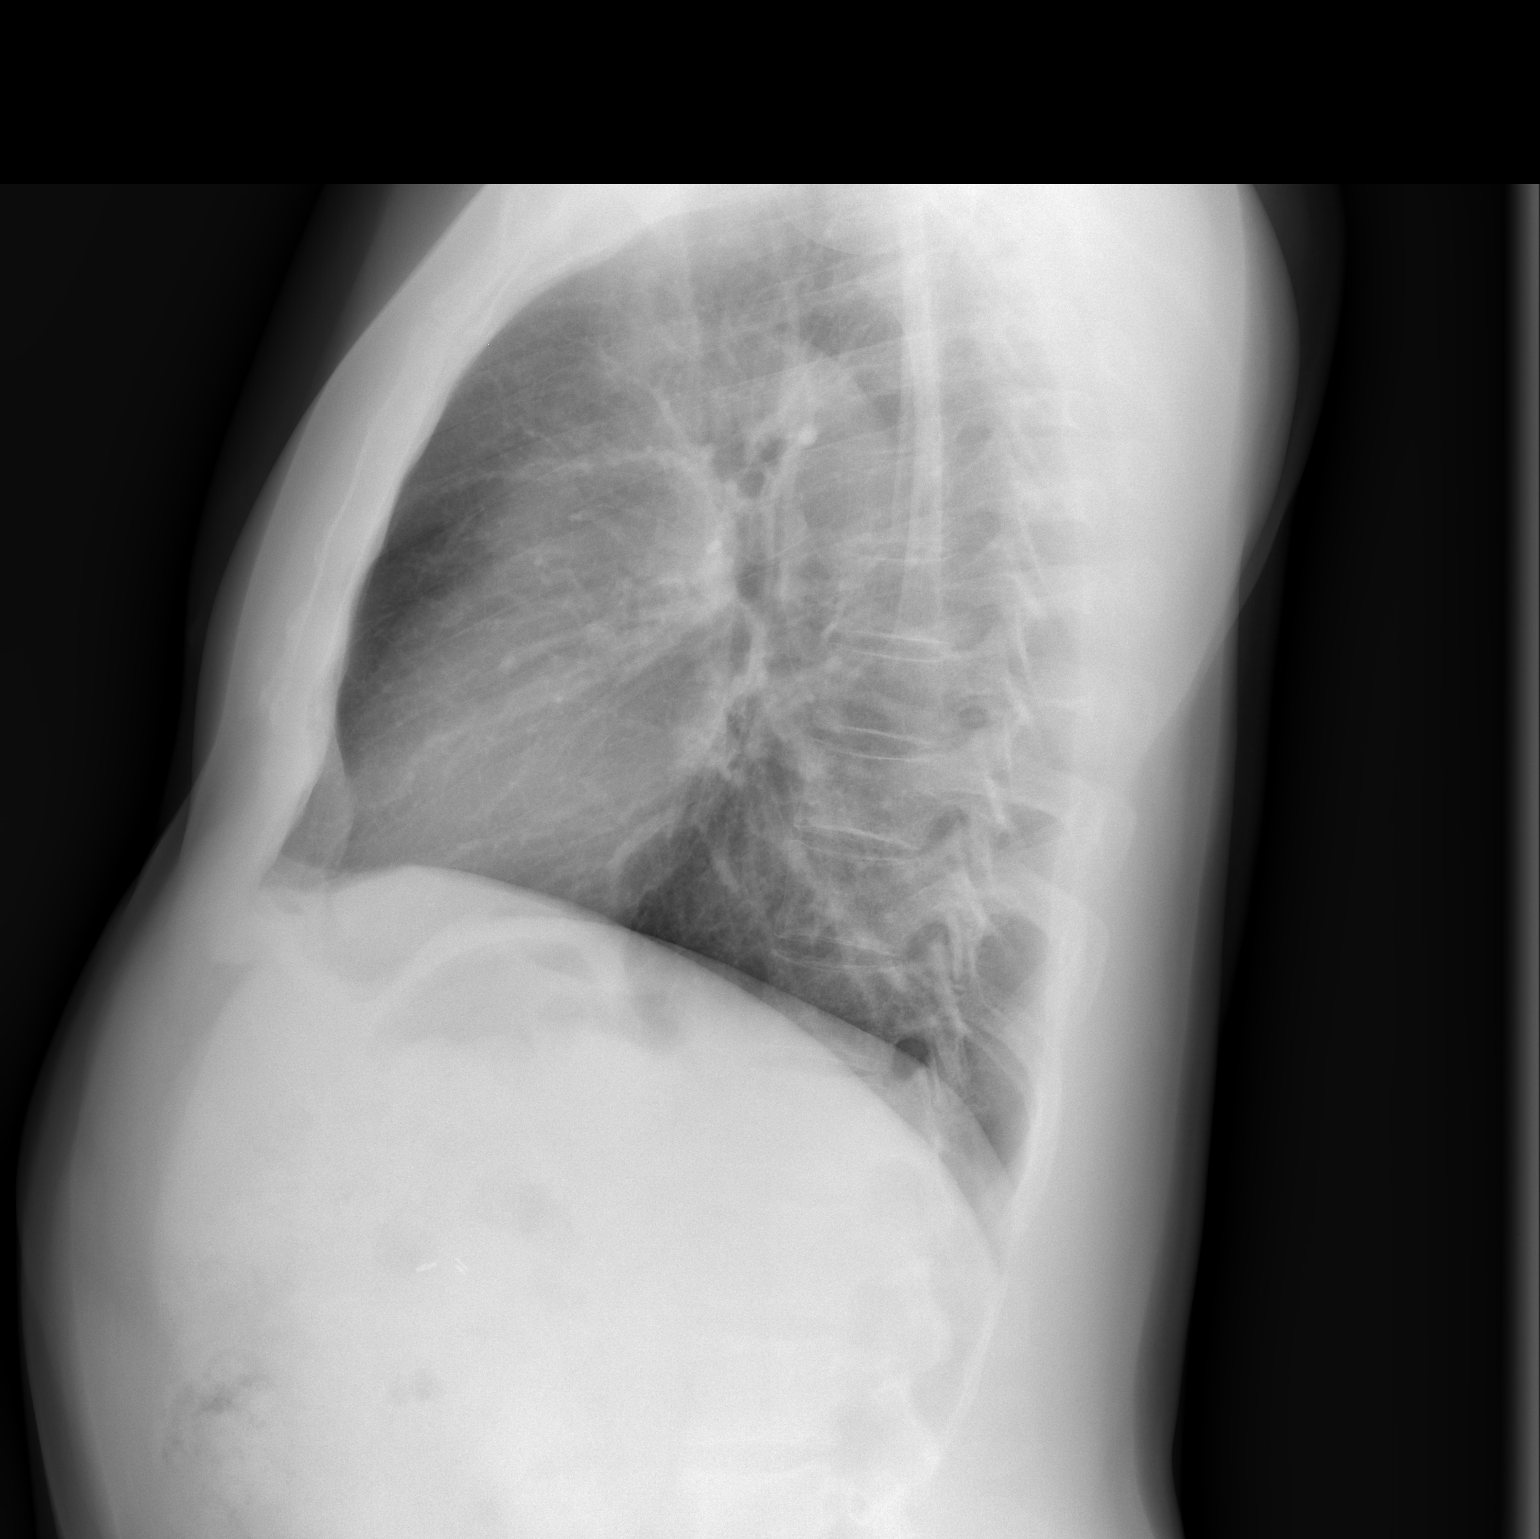

[2 of 2 positions shown; findings below may reference images not displayed]

FINDINGS: Mediastinum and hilar structures are normal. Lungs are clear. Heart
size normal. No pleural effusion or pneumothorax. Surgical clips
right upper quadrant.
IMPRESSION: No acute cardiopulmonary disease.

## 2017-12-29 ENCOUNTER — Other Ambulatory Visit: Payer: Self-pay | Admitting: Urology

## 2018-01-31 NOTE — Patient Instructions (Signed)
Mario Molina  01/31/2018   Your procedure is scheduled on: 02-08-18   Report to [facility] Main  Entrance    Report to admitting at 5:30AM    Call this number if you have problems the morning of surgery [phone]     Remember: Do not eat food or drink liquids :After Midnight. BRUSH YOUR TEETH MORNING OF SURGERY AND RINSE YOUR MOUTH OUT, NO CHEWING GUM CANDY OR MINTS.     Take these medicines the morning of surgery with A SIP OF WATER: NONE  DO NOT TAKE ANY DIABETIC MEDICATIONS DAY OF YOUR SURGERY!!                               You may not have any metal on your body including hair pins and              piercings  Do not wear jewelry, make-up, lotions, powders or perfumes, deodorant              Men may shave face and neck.   Do not bring valuables to the hospital. [place].  Contacts, dentures or bridgework may not be worn into surgery.  Leave suitcase in the car. After surgery it may be brought to your room.                 Please read over the following fact sheets you were given: _____________________________________________________________________             [facility] - Preparing for Surgery Before surgery, you can play an important role.  Because skin is not sterile, your skin needs to be as free of germs as possible.  You can reduce the number of germs on your skin by washing with CHG (chlorahexidine gluconate) soap before surgery.  CHG is an antiseptic cleaner which kills germs and bonds with the skin to continue killing germs even after washing. Please DO NOT use if you have an allergy to CHG or antibacterial soaps.  If your skin becomes reddened/irritated stop using the CHG and inform your nurse when you arrive at Short Stay. Do not shave (including legs and underarms) for at least 48 hours prior to the first CHG shower.  You may shave your face/neck. Please follow these instructions  carefully:  1.  Shower with CHG Soap the night before surgery and the  morning of Surgery.  2.  If you choose to wash your hair, wash your hair first as usual with your  normal  shampoo.  3.  After you shampoo, rinse your hair and body thoroughly to remove the  shampoo.                           4.  Use CHG as you would any other liquid soap.  You can apply chg directly  to the skin and wash                       Gently with a scrungie or clean washcloth.  5.  Apply the CHG Soap to your body ONLY FROM THE NECK DOWN.   Do not use on face/ open  Wound or open sores. Avoid contact with eyes, ears mouth and genitals (private parts).                       Wash face,  Genitals (private parts) with your normal soap.             6.  Wash thoroughly, paying special attention to the area where your surgery  will be performed.  7.  Thoroughly rinse your body with warm water from the neck down.  8.  DO NOT shower/wash with your normal soap after using and rinsing off  the CHG Soap.                9.  Pat yourself dry with a clean towel.            10.  Wear clean pajamas.            11.  Place clean sheets on your bed the night of your first shower and do not  sleep with pets. Day of Surgery : Do not apply any lotions/deodorants the morning of surgery.  Please wear clean clothes to the hospital/surgery center.  FAILURE TO FOLLOW THESE INSTRUCTIONS MAY RESULT IN THE CANCELLATION OF YOUR SURGERY PATIENT SIGNATURE_________________________________  NURSE SIGNATURE__________________________________  ________________________________________________________________________

## 2018-01-31 NOTE — Progress Notes (Signed)
ekg 06-24-17 epic

## 2018-02-02 ENCOUNTER — Encounter (HOSPITAL_COMMUNITY)
Admission: RE | Admit: 2018-02-02 | Discharge: 2018-02-02 | Disposition: A | Discharge status: Home / Self Care | Payer: BC Managed Care – PPO | Source: Ambulatory Visit | Attending: Urology | Admitting: Urology

## 2018-02-02 ENCOUNTER — Other Ambulatory Visit: Payer: Self-pay

## 2018-02-02 ENCOUNTER — Encounter (HOSPITAL_COMMUNITY): Payer: Self-pay

## 2018-02-02 DIAGNOSIS — Z01812 Encounter for preprocedural laboratory examination: Secondary | ICD-10-CM | POA: Insufficient documentation

## 2018-02-02 HISTORY — DX: Stress incontinence (female) (male): N39.3

## 2018-02-02 LAB — CBC
HCT: 41 % (ref 39.0–52.0)
Hemoglobin: 12.8 g/dL — ABNORMAL LOW (ref 13.0–17.0)
MCH: 20.6 pg — ABNORMAL LOW (ref 26.0–34.0)
MCHC: 31.2 g/dL (ref 30.0–36.0)
MCV: 65.9 fL — ABNORMAL LOW (ref 80.0–100.0)
PLATELETS: 244 10*3/uL (ref 150–400)
RBC: 6.22 MIL/uL — ABNORMAL HIGH (ref 4.22–5.81)
RDW: 16.3 % — ABNORMAL HIGH (ref 11.5–15.5)
WBC: 6.2 10*3/uL (ref 4.0–10.5)
nRBC: 0 % (ref 0.0–0.2)

## 2018-02-02 LAB — HEMOGLOBIN A1C
Hgb A1c MFr Bld: 6.3 % — ABNORMAL HIGH (ref 4.8–5.6)
MEAN PLASMA GLUCOSE: 134.11 mg/dL

## 2018-02-02 LAB — GLUCOSE, CAPILLARY: Glucose-Capillary: 127 mg/dL — ABNORMAL HIGH (ref 70–99)

## 2018-02-02 LAB — BASIC METABOLIC PANEL
Anion gap: 8 (ref 5–15)
BUN: 21 mg/dL — ABNORMAL HIGH (ref 6–20)
CHLORIDE: 105 mmol/L (ref 98–111)
CO2: 25 mmol/L (ref 22–32)
Calcium: 9.5 mg/dL (ref 8.9–10.3)
Creatinine, Ser: 1.02 mg/dL (ref 0.61–1.24)
GFR calc Af Amer: >60 mL/min (ref >60)
GFR calc non Af Amer: >60 mL/min (ref >60)
Glucose, Bld: 121 mg/dL — ABNORMAL HIGH (ref 70–99)
Potassium: 4.6 mmol/L (ref 3.5–5.1)
SODIUM: 138 mmol/L (ref 135–145)

## 2018-02-07 MED ORDER — GENTAMICIN SULFATE 40 MG/ML IJ SOLN
400.0000 mg | INTRAVENOUS | Status: DC
  Filled 2018-02-07: qty 10

## 2018-02-08 ENCOUNTER — Other Ambulatory Visit: Payer: Self-pay

## 2018-02-08 ENCOUNTER — Observation Stay (HOSPITAL_COMMUNITY)
Admission: RE | Admit: 2018-02-08 | Discharge: 2018-02-09 | Disposition: A | Discharge status: Home / Self Care | Payer: BC Managed Care – PPO | Attending: Urology | Admitting: Urology

## 2018-02-08 ENCOUNTER — Encounter (HOSPITAL_COMMUNITY)
Admission: RE | Disposition: A | Discharge status: Home / Self Care | Payer: Self-pay | Source: Home / Self Care | Attending: Urology

## 2018-02-08 ENCOUNTER — Ambulatory Visit (HOSPITAL_COMMUNITY): Payer: BC Managed Care – PPO | Admitting: Anesthesiology

## 2018-02-08 ENCOUNTER — Encounter (HOSPITAL_COMMUNITY): Payer: Self-pay

## 2018-02-08 DIAGNOSIS — N393 Stress incontinence (female) (male): Secondary | ICD-10-CM | POA: Diagnosis present

## 2018-02-08 DIAGNOSIS — N3946 Mixed incontinence: Secondary | ICD-10-CM | POA: Diagnosis present

## 2018-02-08 DIAGNOSIS — E7800 Pure hypercholesterolemia, unspecified: Secondary | ICD-10-CM | POA: Diagnosis not present

## 2018-02-08 DIAGNOSIS — C61 Malignant neoplasm of prostate: Secondary | ICD-10-CM | POA: Insufficient documentation

## 2018-02-08 DIAGNOSIS — Z7982 Long term (current) use of aspirin: Secondary | ICD-10-CM | POA: Diagnosis not present

## 2018-02-08 DIAGNOSIS — I10 Essential (primary) hypertension: Secondary | ICD-10-CM | POA: Diagnosis not present

## 2018-02-08 DIAGNOSIS — E119 Type 2 diabetes mellitus without complications: Secondary | ICD-10-CM | POA: Diagnosis not present

## 2018-02-08 DIAGNOSIS — Z7984 Long term (current) use of oral hypoglycemic drugs: Secondary | ICD-10-CM | POA: Insufficient documentation

## 2018-02-08 DIAGNOSIS — R350 Frequency of micturition: Secondary | ICD-10-CM | POA: Insufficient documentation

## 2018-02-08 HISTORY — PX: URETHRAL SLING: SHX2621

## 2018-02-08 LAB — GLUCOSE, CAPILLARY
Glucose-Capillary: 126 mg/dL — ABNORMAL HIGH (ref 70–99)
Glucose-Capillary: 135 mg/dL — ABNORMAL HIGH (ref 70–99)
Glucose-Capillary: 79 mg/dL (ref 70–99)

## 2018-02-08 LAB — HEMOGLOBIN AND HEMATOCRIT, BLOOD
HCT: 36.9 % — ABNORMAL LOW (ref 39.0–52.0)
HEMOGLOBIN: 11.6 g/dL — AB (ref 13.0–17.0)

## 2018-02-08 SURGERY — CREATION, URETHRAL SLING, MALE
Anesthesia: General

## 2018-02-08 MED ORDER — LACTATED RINGERS IV SOLN
INTRAVENOUS | Status: DC
  Administered 2018-02-08: 09:00:00 via INTRAVENOUS
  Administered 2018-02-08: 1000 mL via INTRAVENOUS

## 2018-02-08 MED ORDER — SUGAMMADEX SODIUM 200 MG/2ML IV SOLN
INTRAVENOUS | Status: DC | PRN
  Administered 2018-02-08: 200 mg via INTRAVENOUS

## 2018-02-08 MED ORDER — FENTANYL CITRATE (PF) 250 MCG/5ML IJ SOLN
INTRAMUSCULAR | Status: AC
  Filled 2018-02-08: qty 5

## 2018-02-08 MED ORDER — BELLADONNA ALKALOIDS-OPIUM 16.2-60 MG RE SUPP: 1.0000 | Freq: Four times a day (QID) | RECTAL | Status: DC | PRN

## 2018-02-08 MED ORDER — MIDAZOLAM HCL 5 MG/5ML IJ SOLN
INTRAMUSCULAR | Status: DC | PRN
  Administered 2018-02-08: 2 mg via INTRAVENOUS

## 2018-02-08 MED ORDER — ONDANSETRON HCL 4 MG/2ML IJ SOLN: 4.0000 mg | INTRAMUSCULAR | Status: DC | PRN

## 2018-02-08 MED ORDER — LIDOCAINE HCL (CARDIAC) PF 100 MG/5ML IV SOSY
PREFILLED_SYRINGE | INTRAVENOUS | Status: DC | PRN
  Administered 2018-02-08: 100 mg via INTRAVENOUS

## 2018-02-08 MED ORDER — MORPHINE SULFATE (PF) 2 MG/ML IV SOLN: 2.0000 mg | INTRAVENOUS | Status: DC | PRN

## 2018-02-08 MED ORDER — ONDANSETRON HCL 4 MG/2ML IJ SOLN
INTRAMUSCULAR | Status: AC
  Filled 2018-02-08: qty 2

## 2018-02-08 MED ORDER — DIPHENHYDRAMINE HCL 12.5 MG/5ML PO ELIX: 12.5000 mg | ORAL_SOLUTION | Freq: Four times a day (QID) | ORAL | Status: DC | PRN

## 2018-02-08 MED ORDER — SODIUM CHLORIDE 0.9 % IV SOLN
INTRAVENOUS | Status: AC
  Filled 2018-02-08: qty 500000

## 2018-02-08 MED ORDER — OLMESARTAN MEDOXOMIL-HCTZ 40-25 MG PO TABS: 1.0000 | ORAL_TABLET | Freq: Every day | ORAL | Status: DC

## 2018-02-08 MED ORDER — METOCLOPRAMIDE HCL 5 MG/ML IJ SOLN: 10.0000 mg | Freq: Once | INTRAMUSCULAR | Status: DC | PRN

## 2018-02-08 MED ORDER — ACETAMINOPHEN 325 MG PO TABS
650.0000 mg | ORAL_TABLET | ORAL | Status: DC | PRN
  Administered 2018-02-08: 650 mg via ORAL
  Filled 2018-02-08: qty 2

## 2018-02-08 MED ORDER — MEPERIDINE HCL 50 MG/ML IJ SOLN: 6.2500 mg | INTRAMUSCULAR | Status: DC | PRN

## 2018-02-08 MED ORDER — INSULIN ASPART 100 UNIT/ML ~~LOC~~ SOLN: 0.0000 [IU] | Freq: Three times a day (TID) | SUBCUTANEOUS | Status: DC

## 2018-02-08 MED ORDER — STERILE WATER FOR IRRIGATION IR SOLN
Status: DC | PRN
  Administered 2018-02-08: 3000 mL via INTRAVESICAL

## 2018-02-08 MED ORDER — SUGAMMADEX SODIUM 200 MG/2ML IV SOLN
INTRAVENOUS | Status: AC
  Filled 2018-02-08: qty 2

## 2018-02-08 MED ORDER — MIDAZOLAM HCL 2 MG/2ML IJ SOLN
INTRAMUSCULAR | Status: AC
  Filled 2018-02-08: qty 2

## 2018-02-08 MED ORDER — SODIUM CHLORIDE 0.45 % IV SOLN
INTRAVENOUS | Status: DC
  Administered 2018-02-08 – 2018-02-09 (×2): via INTRAVENOUS

## 2018-02-08 MED ORDER — HYDROCODONE-ACETAMINOPHEN 5-325 MG PO TABS: 1.0000 | ORAL_TABLET | Freq: Four times a day (QID) | ORAL | 0 refills | Status: AC | PRN

## 2018-02-08 MED ORDER — HYDROCHLOROTHIAZIDE 25 MG PO TABS
25.0000 mg | ORAL_TABLET | Freq: Every day | ORAL | Status: DC
  Administered 2018-02-08: 25 mg via ORAL
  Filled 2018-02-08: qty 1

## 2018-02-08 MED ORDER — HYDROCODONE-ACETAMINOPHEN 5-325 MG PO TABS: 1.0000 | ORAL_TABLET | ORAL | Status: DC | PRN

## 2018-02-08 MED ORDER — FENTANYL CITRATE (PF) 100 MCG/2ML IJ SOLN
INTRAMUSCULAR | Status: DC | PRN
  Administered 2018-02-08: 100 ug via INTRAVENOUS
  Administered 2018-02-08: 50 ug via INTRAVENOUS
  Administered 2018-02-08: 100 ug via INTRAVENOUS

## 2018-02-08 MED ORDER — ROCURONIUM BROMIDE 100 MG/10ML IV SOLN
INTRAVENOUS | Status: DC | PRN
  Administered 2018-02-08: 20 mg via INTRAVENOUS
  Administered 2018-02-08: 10 mg via INTRAVENOUS
  Administered 2018-02-08: 50 mg via INTRAVENOUS

## 2018-02-08 MED ORDER — DIPHENHYDRAMINE HCL 50 MG/ML IJ SOLN: 12.5000 mg | Freq: Four times a day (QID) | INTRAMUSCULAR | Status: DC | PRN

## 2018-02-08 MED ORDER — ONDANSETRON HCL 4 MG/2ML IJ SOLN
INTRAMUSCULAR | Status: DC | PRN
  Administered 2018-02-08: 4 mg via INTRAVENOUS

## 2018-02-08 MED ORDER — SODIUM CHLORIDE 0.9 % IV SOLN
INTRAVENOUS | Status: DC | PRN
  Administered 2018-02-08: 500 mL

## 2018-02-08 MED ORDER — PROPOFOL 10 MG/ML IV BOLUS
INTRAVENOUS | Status: DC | PRN
  Administered 2018-02-08: 150 mg via INTRAVENOUS

## 2018-02-08 MED ORDER — IRBESARTAN 300 MG PO TABS
300.0000 mg | ORAL_TABLET | Freq: Every day | ORAL | Status: DC
  Administered 2018-02-08: 300 mg via ORAL
  Filled 2018-02-08: qty 1

## 2018-02-08 MED ORDER — PROPOFOL 10 MG/ML IV BOLUS
INTRAVENOUS | Status: AC
  Filled 2018-02-08: qty 20

## 2018-02-08 MED ORDER — FENTANYL CITRATE (PF) 100 MCG/2ML IJ SOLN: 25.0000 ug | INTRAMUSCULAR | Status: DC | PRN

## 2018-02-08 MED ORDER — LACTATED RINGERS IV SOLN: INTRAVENOUS | Status: DC

## 2018-02-08 MED ORDER — SIMVASTATIN 10 MG PO TABS
5.0000 mg | ORAL_TABLET | Freq: Every evening | ORAL | Status: DC
  Administered 2018-02-08: 5 mg via ORAL
  Filled 2018-02-08: qty 1

## 2018-02-08 MED ORDER — GENTAMICIN SULFATE 40 MG/ML IJ SOLN
400.0000 mg | INTRAVENOUS | Status: AC
  Administered 2018-02-08: 400 mg via INTRAVENOUS
  Filled 2018-02-08: qty 10

## 2018-02-08 MED ORDER — CEFAZOLIN SODIUM-DEXTROSE 2-4 GM/100ML-% IV SOLN
2.0000 g | INTRAVENOUS | Status: AC
  Administered 2018-02-08: 2 g via INTRAVENOUS
  Filled 2018-02-08: qty 100

## 2018-02-08 SURGICAL SUPPLY — 54 items
BAG URINE DRAINAGE (UROLOGICAL SUPPLIES) ×3 IMPLANT
BLADE HEX COATED 2.75 (ELECTRODE) IMPLANT
BLADE SURG 15 STRL LF DISP TIS (BLADE) ×1 IMPLANT
BLADE SURG 15 STRL SS (BLADE) ×2
BLADE SURG SZ10 CARB STEEL (BLADE) ×3 IMPLANT
BNDG GAUZE ELAST 4 BULKY (GAUZE/BANDAGES/DRESSINGS) ×3 IMPLANT
BRIEF STRETCH FOR OB PAD LRG (UNDERPADS AND DIAPERS) ×3 IMPLANT
CATH FOLEY 2WAY SLVR  5CC 14FR (CATHETERS) ×2
CATH FOLEY 2WAY SLVR 5CC 14FR (CATHETERS) ×1 IMPLANT
COVER MAYO STAND STRL (DRAPES) ×3 IMPLANT
COVER SURGICAL LIGHT HANDLE (MISCELLANEOUS) ×3 IMPLANT
COVER WAND RF STERILE (DRAPES) IMPLANT
DECANTER SPIKE VIAL GLASS SM (MISCELLANEOUS) IMPLANT
DERMABOND ADVANCED (GAUZE/BANDAGES/DRESSINGS) ×6
DERMABOND ADVANCED .7 DNX12 (GAUZE/BANDAGES/DRESSINGS) ×3 IMPLANT
DRAPE SHEET LG 3/4 BI-LAMINATE (DRAPES) IMPLANT
DRSG TELFA 3X8 NADH (GAUZE/BANDAGES/DRESSINGS) ×3 IMPLANT
ELECT PENCIL ROCKER SW 15FT (MISCELLANEOUS) ×3 IMPLANT
GAUZE 4X4 16PLY RFD (DISPOSABLE) ×6 IMPLANT
GLOVE BIO SURGEON STRL SZ 6.5 (GLOVE) ×4 IMPLANT
GLOVE BIO SURGEONS STRL SZ 6.5 (GLOVE) ×2
GLOVE BIOGEL M STRL SZ7.5 (GLOVE) ×6 IMPLANT
GLOVE BIOGEL PI IND STRL 7.0 (GLOVE) ×1 IMPLANT
GLOVE BIOGEL PI INDICATOR 7.0 (GLOVE) ×2
GOWN STRL REUS W/TWL LRG LVL3 (GOWN DISPOSABLE) ×6 IMPLANT
GOWN STRL REUS W/TWL XL LVL3 (GOWN DISPOSABLE) ×3 IMPLANT
HEMOSTAT SURGICEL 4X8 (HEMOSTASIS) ×3 IMPLANT
HOLDER FOLEY CATH W/STRAP (MISCELLANEOUS) ×3 IMPLANT
KIT BASIN OR (CUSTOM PROCEDURE TRAY) ×3 IMPLANT
PACK CYSTO (CUSTOM PROCEDURE TRAY) ×3 IMPLANT
PLUG CATH AND CAP STER (CATHETERS) ×3 IMPLANT
PROTECTOR NERVE ULNAR (MISCELLANEOUS) ×3 IMPLANT
SHEET LAVH (DRAPES) ×3 IMPLANT
SLING ADVANCE MALE SYSTEM ×3 IMPLANT
SUT MNCRL AB 4-0 PS2 18 (SUTURE) ×3 IMPLANT
SUT SILK 2 0 30  PSL (SUTURE) ×2
SUT SILK 2 0 30 PSL (SUTURE) ×1 IMPLANT
SUT VIC AB 2-0 CT1 27 (SUTURE)
SUT VIC AB 2-0 CT1 27XBRD (SUTURE) IMPLANT
SUT VIC AB 2-0 SH 27 (SUTURE)
SUT VIC AB 2-0 SH 27X BRD (SUTURE) IMPLANT
SUT VIC AB 3-0 PS2 18 (SUTURE)
SUT VIC AB 3-0 PS2 18XBRD (SUTURE) IMPLANT
SUT VIC AB 3-0 SH 27 (SUTURE) ×12
SUT VIC AB 3-0 SH 27XBRD (SUTURE) ×6 IMPLANT
SUT VIC AB 4-0 PS2 27 (SUTURE) IMPLANT
SUT VIC AB 4-0 RB1 27 (SUTURE) ×4
SUT VIC AB 4-0 RB1 27XBRD (SUTURE) ×2 IMPLANT
SYR 10ML LL (SYRINGE) ×3 IMPLANT
TAPE UMBILICAL COTTON 1/8X30 (MISCELLANEOUS) ×3 IMPLANT
TOWEL OR 17X26 10 PK STRL BLUE (TOWEL DISPOSABLE) ×3 IMPLANT
TUBING CONNECTING 10 (TUBING) ×2 IMPLANT
TUBING CONNECTING 10' (TUBING) ×1
YANKAUER SUCT BULB TIP 10FT TU (MISCELLANEOUS) ×3 IMPLANT

## 2018-02-08 NOTE — Progress Notes (Signed)
Patient ambulated around department for the second time since surgery. Tolerated well. No c/o pain. Pt OOB to chair for supper. Wife at bedside.

## 2018-02-08 NOTE — Transfer of Care (Signed)
Immediate Anesthesia Transfer of Care Note  Patient: Mario Molina  Procedure(s) Performed: MALE SLING, FLEXIBILE CYSTOSCOPY (N/A )  Patient Location: PACU  Anesthesia Type:General  Level of Consciousness: drowsy, patient cooperative and responds to stimulation  Airway & Oxygen Therapy: Patient Spontanous Breathing and Patient connected to face mask oxygen  Post-op Assessment: Report given to RN and Post -op Vital signs reviewed and stable  Post vital signs: Reviewed and stable  Last Vitals:  Vitals Value Taken Time  BP    Temp    Pulse 74 02/08/2018  9:50 AM  Resp 16 02/08/2018  9:50 AM  SpO2 100 % 02/08/2018  9:50 AM  Vitals shown include unvalidated device data.  Last Pain:  Vitals:   02/08/18 0636  TempSrc: Oral  PainSc:       Patients Stated Pain Goal: 4 (16/10/96 0454)  Complications: No apparent anesthesia complications

## 2018-02-08 NOTE — Anesthesia Procedure Notes (Signed)
Procedure Name: Intubation Date/Time: 02/08/2018 7:41 AM Performed by: Glory Buff, CRNA Pre-anesthesia Checklist: Patient identified, Emergency Drugs available, Suction available and Patient being monitored Patient Re-evaluated:Patient Re-evaluated prior to induction Oxygen Delivery Method: Circle system utilized Preoxygenation: Pre-oxygenation with 100% oxygen Induction Type: IV induction Ventilation: Mask ventilation without difficulty Laryngoscope Size: Miller and 3 Grade View: Grade I Tube type: Oral Tube size: 7.5 mm Number of attempts: 1 Airway Equipment and Method: Stylet and Oral airway Placement Confirmation: ETT inserted through vocal cords under direct vision,  positive ETCO2 and breath sounds checked- equal and bilateral Secured at: 21 cm Tube secured with: Tape Dental Injury: Teeth and Oropharynx as per pre-operative assessment

## 2018-02-08 NOTE — Interval H&P Note (Signed)
History and Physical Interval Note:  02/08/2018 7:14 AM  Mario Molina  has presented today for surgery, with the diagnosis of STRESS INCONTINCE  The various methods of treatment have been discussed with the patient and family. After consideration of risks, benefits and other options for treatment, the patient has consented to  Procedure(s): MALE SLING, CYSTOSCOPY (N/A) as a surgical intervention .  The patient's history has been reviewed, patient examined, no change in status, stable for surgery.  I have reviewed the patient's chart and labs.  Questions were answered to the patient's satisfaction.     Satish Hammers A

## 2018-02-08 NOTE — Anesthesia Postprocedure Evaluation (Signed)
Anesthesia Post Note  Patient: Mario Molina  Procedure(s) Performed: MALE SLING, FLEXIBILE CYSTOSCOPY (N/A )     Patient location during evaluation: PACU Anesthesia Type: General Level of consciousness: awake and alert Pain management: pain level controlled Vital Signs Assessment: post-procedure vital signs reviewed and stable Respiratory status: spontaneous breathing, nonlabored ventilation, respiratory function stable and patient connected to nasal cannula oxygen Cardiovascular status: blood pressure returned to baseline and stable Postop Assessment: no apparent nausea or vomiting Anesthetic complications: no    Last Vitals:  Vitals:   02/08/18 1230 02/08/18 1257  BP: 127/73 139/83  Pulse: (!) 59 62  Resp:  15  Temp: 36.8 C 36.6 C  SpO2: 100% 100%    Last Pain:  Vitals:   02/08/18 1259  TempSrc:   PainSc: 4                  Montez Hageman

## 2018-02-08 NOTE — Op Note (Signed)
Preoperative diagnosis: Stress urinary incontinence Postoperative diagnosis: Stress urinary incontinence Surgery: Insertion of male sling and cystoscopy Surgeon: Dr. Nicki Reaper Katalina Magri Assistant: Estill Bamberg dancy  The assistant was present and necessary for all steps of the operation described. The assistant played a critical role assisting during the operation  The patient was prepped and draped in usual fashion.  Extra care was taken with leg positioning to minimize the risk of compartment syndrome and neuropathy and deep vein thrombosis.  He had a long perineum.  I made approximately 6 cm perineal incision dissecting down through subcutaneous tissue mobilizing subcutaneous tissue from the bulbospongiosus muscle.  Muscle was split in the midline and I mobilized it off the bulbar urethra at the bifurcation of the corporal bodies.  I mobilized well near the peroneal tendon and took down approximately 4 mm of the peroneal tendon.  There was little bit of bleeding in this area and I placed 1 gentle 3-0 Vicryl interrupted suture.  Hemostasis was excellent.  I was very happy with the mobility of the bulbar urethra and the anatomy at this stage.  I placed a 3-0 Vicryl at the area of the peroneal tendon.  I was very diligent marking the position to pass the trocar below the easily palpable adductor tendon bilaterally.  I passed the foramen needle to the inferior ramus and then through the foramen.  I made a scalpel incision.  With the described technique I passed the trocar through the external and then internal obturator around bone onto the pulp of my finger which is in the upper triangle between the ramus and the urethra.  It was delivered easily.  With the described technique the mesh was applied and brought out gently through the incision.  The exact technique was used on the opposite side.  I checked all anatomy and the mesh was in good position.  I tensioned it down first over my finger and then gently laid  it onto the urethra nice and flat.  3-0 Vicryl at the peroneal tendon was brought through the most proximal aspect of the mesh.  I placed 2 interrupted 3-0 Vicryl at the distal end.  It laid in nicely.  I cystoscoped the patient with flexible cystoscopy.  Bladder was normal.  Bladder neck was normal.  Urethra was normal.  There is no injury.  With the penis on stretch and with good flow I gently tightened the male sling just to get circumferential coaptation of the urethra.  I stopped at that location.  Visually it looked excellent cystoscopically.  Visually it looked excellent perineally.  I closed the perineum with 3 layers of 3-0 Vicryl the first involving the bulbospongiosus muscle reapproximation.  Because of a little bit of oozing I actually placed Surgicel deep in the perineum on the patient's right side.  I closed the skin with 4-0 Monocryl.  The mesh was cut below the skin level and each inguinal incision was closed with interrupted 4-0 Vicryl and Dermabond.  The Foley catheter was easily replaced after tensioning the sling.  Blood loss was less than 50 mL.  I was very pleased with the entire operation.  Hopefully he will reach his treatment goal.

## 2018-02-08 NOTE — Progress Notes (Signed)
Assumed patient care from [facility], RN. Agree with prior patient assessment/documentaion. Pt rates bilateral groin pain 4/10, dull/sore/stiffness. Pt encouraged to reposition self frequently, dangle on edge of bed and ambulate. Verbalized understanding. Pt is highly motivated. Will continue to monitor.

## 2018-02-08 NOTE — Anesthesia Preprocedure Evaluation (Addendum)
Anesthesia Evaluation  Patient identified by MRN, date of birth, ID band Patient awake    Reviewed: Allergy & Precautions, NPO status , Patient's Chart, lab work & pertinent test results  Airway Mallampati: II  TM Distance: >3 FB Neck ROM: Full    Dental no notable dental hx.    Pulmonary neg pulmonary ROS,    Pulmonary exam normal breath sounds clear to auscultation       Cardiovascular hypertension, Pt. on medications Normal cardiovascular exam Rhythm:Regular Rate:Normal     Neuro/Psych negative neurological ROS  negative psych ROS   GI/Hepatic negative GI ROS, Neg liver ROS,   Endo/Other  diabetes, Type 2, Oral Hypoglycemic Agents  Renal/GU negative Renal ROS  negative genitourinary   Musculoskeletal negative musculoskeletal ROS (+)   Abdominal   Peds negative pediatric ROS (+)  Hematology negative hematology ROS (+)   Anesthesia Other Findings   Reproductive/Obstetrics negative OB ROS                             Anesthesia Physical Anesthesia Plan  ASA: II  Anesthesia Plan: General   Post-op Pain Management:    Induction: Intravenous  PONV Risk Score and Plan: 2 and Ondansetron, Midazolam and Treatment may vary due to age or medical condition  Airway Management Planned: Oral ETT  Additional Equipment:   Intra-op Plan:   Post-operative Plan: Extubation in OR  Informed Consent: I have reviewed the patients History and Physical, chart, labs and discussed the procedure including the risks, benefits and alternatives for the proposed anesthesia with the patient or authorized representative who has indicated his/her understanding and acceptance.   Dental advisory given  Plan Discussed with: CRNA  Anesthesia Plan Comments:         Anesthesia Quick Evaluation

## 2018-02-08 NOTE — H&P (Signed)
I was consulted by the above provider to assess the patient's stress urinary incontinence persisting since his prostatectomy approximately 20 months ago. He does not leak with coughing sneezing but does sometimes with bending and lifting. He leaks more when he is active. He can soak 1 pad a day and other times it is damp or moderately wet. He has a little bit of urge incontinence and denies bedwetting he voids every 2-3 hours and gets up once a night reports a good flow   He is right-handed and has not had hernia repair he is on oral hypoglycemics   Male genitalia normal; negative cough test in the standing position   the patient has mild mixed incontinence. He primarily has stress incontinence with activity. He has mild frequency and nocturia. I agree with Dr. Alinda Money he may be an excellent candidate to pursue a male sling.   The patient and I briefly talked about a male sling. We briefly discussed about efficacy and a few of the risks. I did emphasize that he has a narrow margin for improvement and he reaches treatment goal.   On urodynamics the patient did not void and was catheterized for 100 mL. He had a vasovagal event that was significant. Maximum bladder capacity and urodynamics was 417 mL. Bladder was unstable reaching pressures of 29 cm of water. He was able to inhibit the contractions. The urodynamics line had to be removed to document stress incontinence. At 150 mL his leak point pressure was as low as 46 cm of water so she was mild leakage. During voluntary voiding he voided 400 mL with maximum flow of 34 mL/second. Maximum voiding pressure of 20 cm of water. EMG activity increased some during the voiding phase. He emptied efficiently. There was mild funneling of the bladder neck fluoroscopically.   Picture was drawn. Male sling discussed. Emphasis on narrow margin for improvement discussed and reaching treatment goals. Myrbetriq samples offered.   When I cystoscoped from today he had what  appeared to be 1 surgical clip at 12:00 p.m. at otherwise a healthy looking bladder neck. the penile and bulbar urethra was normal. The membranous urethra looked normal. The trigone and bladder mucosa was normal. There is no cystitis. When I pulled back the scope the bladder neck I could see what appeared to be a surgical clip at 12:00 p.m. but the anastomosis otherwise looked healthy   Myrbetriq 50 mg samples and prescription given. He will fill it unless it helps him a great deal. We will get him in to see Dr. Alinda Money in the next few weeks. I believe he will offer removing the stable. If he continues to leak afterwards I am assuming the patient will want a male sling and I can book it through Dr. B or reassess the patient. I did mention artificial sphincter.   Today  Incontinence possibly minimally better for the same. Clinically not infected. Frequency stable   Mario Molina another picture went through the entire template and complications as previously documented on the male sling. He would like to proceed. He understands a narrow margin for improvement. Rare risk of retention also discussed     ALLERGIES: No Allergies    MEDICATIONS: Myrbetriq 50 mg tablet, extended release 24 hr 1 tablet PO Daily  Benicar 40 mg tablet Oral  Metformin Hcl 1,000 mg tablet Oral  Sildenafil Citrate 100 mg tablet     Notes: PA for cialis 5mg  sent to [facility] 12/12/15 hs-cma   GU PSH: Complex cystometrogram,  w/ void pressure and urethral pressure profile studies, any technique - 05/19/2017 Complex Uroflow - 05/19/2017 Cysto Remove Stent FB Com - 07/01/2017 Cystoscopy - 05/26/2017 Emg surf Electrd - 05/19/2017 Inject For cystogram - 05/19/2017 Intrabd voidng Press - 05/19/2017 Laparoscopy; Lymphadenectomy - 2017 Prostate Needle Biopsy - 2017 Robotic Radical Prostatectomy - 2017      PSH Notes: Cholecystectomy   NON-GU PSH: Cholecystectomy (open) - 2017 Surgical Pathology, Gross And Microscopic Examination For  Prostate Needle - 2017    GU PMH: Mixed incontinence - 05/12/2017 Nocturia - 05/12/2017 Urinary Frequency - 05/12/2017 ED due to arterial insufficiency - 2018 Stress Incontinence - 2018 Prostate Cancer, Prostate cancer - 2017      PMH Notes:   1) Prostate cancer: He is s/p a BNS RAL radical prostatectomy and BPLND on 12/05/15.   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 He was evaluated by Dr. Matilde Sprang in 2019 and cystoscopy revealed a bladder neck calculus. This was removed endoscopically in April 2019 and he returned for further evaluation with Dr. Matilde Sprang.   NON-GU PMH: Diabetes Type 2 Hypercholesterolemia Hypertension    FAMILY HISTORY: malignant neoplasm of prostate - Runs In Family sickle cell anemia - Runs In Family   SOCIAL HISTORY: Marital Status: Married Preferred Language: English; Ethnicity: Not Hispanic Or Latino; Race: White Current Smoking Status: Patient has never smoked.   Tobacco Use Assessment Completed: Used Tobacco in last 30 days? Does drink.  Drinks 1 caffeinated drink per day.     Notes: Alcohol use, Married, Mother alive and healthy, Father alive and healthy, Number of children, Never a smoker   REVIEW OF SYSTEMS:    GU Review Male:   Patient denies frequent urination, hard to postpone urination, burning/ pain with urination, get up at night to urinate, leakage of urine, stream starts and stops, trouble starting your stream, have to strain to urinate , erection problems, and penile pain.  Gastrointestinal (Upper):   Patient denies nausea, vomiting, and indigestion/ heartburn.  Gastrointestinal (Lower):   Patient denies diarrhea and constipation.  Constitutional:   Patient denies fever, night sweats, weight loss, and fatigue.  Skin:   Patient denies skin rash/ lesion and itching.  Eyes:   Patient  denies blurred vision and double vision.  Ears/ Nose/ Throat:   Patient denies sore throat and sinus problems.  Hematologic/Lymphatic:   Patient denies swollen glands and easy bruising.  Cardiovascular:   Patient denies leg swelling and chest pains.  Respiratory:   Patient denies cough and shortness of breath.  Endocrine:   Patient denies excessive thirst.  Musculoskeletal:   Patient denies back pain and joint pain.  Neurological:   Patient denies headaches and dizziness.  Psychologic:   Patient denies depression and anxiety.   VITAL SIGNS:      12/24/2017 09:06 AM  Weight 200.7 lb / 91.04 kg  Height 69 in / 175.26 cm  BP 157/79 mmHg  Pulse 69 /min  Temperature 97.4 F / 36.3 C  BMI 29.6 kg/m   PAST DATA REVIEWED:  Source Of History:  Patient   11/17/17 04/21/17 10/13/16 03/13/16 04/09/15  PSA  Total PSA <0.015 ng/mL <0.015 ng/mL <0.015 ng/dL < 0.015 ng/dl 5.11   Free PSA     1.30   % Free PSA     25     PROCEDURES:  Urinalysis Dipstick Dipstick Cont'd  Color: Yellow Bilirubin: Neg mg/dL  Appearance: Clear Ketones: Neg mg/dL  Specific Gravity: 1.025 Blood: Neg ery/uL  pH: <=5.0 Protein: Neg mg/dL  Glucose: Neg mg/dL Urobilinogen: 0.2 mg/dL    Nitrites: Neg    Leukocyte Esterase: Neg leu/uL    ASSESSMENT:      ICD-10 Details  1 GU:   Mixed incontinence - N39.46   2   Stress Incontinence - N39.3   3   Urinary Frequency - R35.0               Notes:   I drew him a picture and we talked about a male sling in detail. Pros, cons, general surgical and anesthetic risks, and other options including behavioral therapy, the artificial sphincter, and watchful waiting were discussed. He understands that male slings are successful in 80-85% of cases for stress incontinence (dry in approximately ), 50% for urge incontinence, and that in a small % of cases the incontinence can worsen. Surgical risks were described but not limited to the discussion of injury to neighboring  structures including the bowel (with possible life-threatening sepsis and colostomy), bladder, urethra, and tendons/muscles (all resulting in further surgery). We talked about injury to nerves/soft tissue leading to debilitating and intractable pelvic, abdominal, and lower extremity pain syndromes and neuropathies. The risks of urinary retention requiring catheterization and slowing of urinary stream were discussed. The risk of urethral erosion and infection were discussed with sequelae. Bleeding risks with transfusion rates and risks of perineal numbness and erectile dysfunction were discussed. The usual post-operative course was described. The patient understands that he might not reach his treatment goal and that he might be worse following surgery.    PLAN:           Schedule Return Visit/Planned Activity: Return PRN - Office Visit   After a thorough review of the management options for the patient's condition the patient  elected to proceed with surgical therapy as noted above. We have discussed the potential benefits and risks of the procedure, side effects of the proposed treatment, the likelihood of the patient achieving the goals of the procedure, and any potential problems that might occur during the procedure or recuperation. Informed consent has been obtained.

## 2018-02-09 DIAGNOSIS — N3946 Mixed incontinence: Secondary | ICD-10-CM | POA: Diagnosis not present

## 2018-02-09 LAB — HEMOGLOBIN AND HEMATOCRIT, BLOOD
HEMATOCRIT: 37.3 % — AB (ref 39.0–52.0)
Hemoglobin: 11.8 g/dL — ABNORMAL LOW (ref 13.0–17.0)

## 2018-02-09 LAB — BASIC METABOLIC PANEL
Anion gap: 8 (ref 5–15)
BUN: 18 mg/dL (ref 6–20)
CO2: 26 mmol/L (ref 22–32)
Calcium: 8.6 mg/dL — ABNORMAL LOW (ref 8.9–10.3)
Chloride: 104 mmol/L (ref 98–111)
Creatinine, Ser: 1.09 mg/dL (ref 0.61–1.24)
GFR calc non Af Amer: >60 mL/min (ref >60)
Glucose, Bld: 119 mg/dL — ABNORMAL HIGH (ref 70–99)
Potassium: 4 mmol/L (ref 3.5–5.1)
SODIUM: 138 mmol/L (ref 135–145)

## 2018-02-09 LAB — GLUCOSE, CAPILLARY
Glucose-Capillary: 97 mg/dL (ref 70–99)
Glucose-Capillary: 129 mg/dL — ABNORMAL HIGH (ref 70–99)

## 2018-02-09 NOTE — Plan of Care (Signed)
Pt ambulated 500 ft and tolerated well. Pt did not require pain meds this shift. Dressing clean, dry and intact. No drainage.

## 2018-02-09 NOTE — Discharge Instructions (Signed)
I have reviewed discharge instructions in detail with the patient. They will follow-up with me or their physician as scheduled. My nurse will also be calling the patients as per protocol. As discussed with Dr. Elisabeth Strom. ° °You may resume aspirin, advil, aleve, vitamins, and supplements 7 days after surgery. °

## 2018-02-09 NOTE — Discharge Summary (Signed)
Date of admission: 02/08/2018  Date of discharge: 02/09/2018  Admission diagnosis: stress incontinence  Discharge diagnosis: stress incontinence  Secondary diagnoses: prostate cancer  History and Physical: For full details, please see admission history and physical. Briefly, Mario Molina is a 57 y.o. year old patient with above diagnosis.   Hospital Course: Male sling excellent post op course  Laboratory values:  Recent Labs    02/08/18 1543 02/09/18 0432  HGB 11.6* 11.8*  HCT 36.9* 37.3*   Recent Labs    02/09/18 0432  CREATININE 1.09    Disposition: Home  Discharge instruction: The patient was instructed to be ambulatory but told to refrain from heavy lifting, strenuous activity, or driving. Detailed  Discharge medications:  Allergies as of 02/09/2018   No Known Allergies     Medication List    STOP taking these medications   aspirin EC 81 MG tablet   multivitamin with minerals Tabs tablet     TAKE these medications   HYDROcodone-acetaminophen 5-325 MG tablet Commonly known as:  NORCO Take 1-2 tablets by mouth every 6 (six) hours as needed for moderate pain.   metFORMIN 500 MG 24 hr tablet Commonly known as:  GLUCOPHAGE-XR Take 500 mg by mouth daily with breakfast.   olmesartan-hydrochlorothiazide 40-25 MG tablet Commonly known as:  BENICAR HCT Take 1 tablet by mouth daily.   simvastatin 5 MG tablet Commonly known as:  ZOCOR Take 5 mg by mouth every evening.       Followup:  Follow-up Information    [place] Sigmon, Nicki Reaper, MD.   Specialty:  Urology Why:  office will call you with date and time of appt.  Contact information: [place] [id number] [phone]

## 2018-02-09 NOTE — Progress Notes (Signed)
Looks good Incisions good Void trial Post op detailed

## 2018-02-10 ENCOUNTER — Encounter (HOSPITAL_COMMUNITY): Payer: Self-pay | Admitting: Urology

## 2019-04-28 ENCOUNTER — Ambulatory Visit: Payer: BC Managed Care – PPO | Attending: Internal Medicine

## 2019-04-28 DIAGNOSIS — Z23 Encounter for immunization: Secondary | ICD-10-CM

## 2019-04-28 NOTE — Progress Notes (Signed)
   Covid-19 Vaccination Clinic  Name:  Delquan Huibregtse    MRN: [id number] DOB: [birth year]  04/28/2019  Mr. Kolenovic was observed post Covid-19 immunization for 15 minutes without incident. He was provided with Vaccine Information Sheet and instruction to access the V-Safe system.   Mr. Lempert was instructed to call 911 with any severe reactions post vaccine: Marland Kitchen Difficulty breathing  . Swelling of face and throat  . A fast heartbeat  . A bad rash all over body  . Dizziness and weakness   Immunizations Administered    Name Date Dose VIS Date Route   Pfizer COVID-19 Vaccine 04/28/2019 10:55 AM 0.3 mL 02/03/2019 Intramuscular   Manufacturer: [place]   Lot: [id number]   [place]: [id number]

## 2019-05-24 ENCOUNTER — Ambulatory Visit: Payer: BC Managed Care – PPO | Attending: Internal Medicine

## 2019-05-24 DIAGNOSIS — Z23 Encounter for immunization: Secondary | ICD-10-CM

## 2019-05-24 NOTE — Progress Notes (Signed)
   Covid-19 Vaccination Clinic  Name:  Mario Molina    MRN: [id number] DOB: Apr 16, 1960  05/24/2019  [name] was observed post Covid-19 immunization for 15 minutes without incident. He was provided with Vaccine Information Sheet and instruction to access the V-Safe system.   [name] was instructed to call 911 with any severe reactions post vaccine: Marland Kitchen Difficulty breathing  . Swelling of face and throat  . A fast heartbeat  . A bad rash all over body  . Dizziness and weakness   Immunizations Administered    Name Date Dose VIS Date Route   Pfizer COVID-19 Vaccine 05/24/2019 10:19 AM 0.3 mL 02/03/2019 Intramuscular   Manufacturer: [place]   Lot: [id number]   [place]: [id number]
